# Patient Record
Sex: Female | Born: 1945 | Race: White | Hispanic: No | Marital: Married
Health system: Southern US, Community
[De-identification: ages and names within clinical notes are randomized; demographics above are authoritative.]

## PROBLEM LIST (undated history)

## (undated) DIAGNOSIS — R112 Nausea with vomiting, unspecified: Secondary | ICD-10-CM

## (undated) DIAGNOSIS — Z9889 Other specified postprocedural states: Secondary | ICD-10-CM

## (undated) DIAGNOSIS — M1990 Unspecified osteoarthritis, unspecified site: Secondary | ICD-10-CM

## (undated) DIAGNOSIS — K219 Gastro-esophageal reflux disease without esophagitis: Secondary | ICD-10-CM

## (undated) DIAGNOSIS — F32A Depression, unspecified: Secondary | ICD-10-CM

## (undated) DIAGNOSIS — F419 Anxiety disorder, unspecified: Secondary | ICD-10-CM

## (undated) DIAGNOSIS — Z90710 Acquired absence of both cervix and uterus: Secondary | ICD-10-CM

## (undated) DIAGNOSIS — R0600 Dyspnea, unspecified: Secondary | ICD-10-CM

## (undated) DIAGNOSIS — R002 Palpitations: Secondary | ICD-10-CM

## (undated) DIAGNOSIS — R011 Cardiac murmur, unspecified: Secondary | ICD-10-CM

## (undated) DIAGNOSIS — F329 Major depressive disorder, single episode, unspecified: Secondary | ICD-10-CM

## (undated) DIAGNOSIS — I2510 Atherosclerotic heart disease of native coronary artery without angina pectoris: Secondary | ICD-10-CM

## (undated) DIAGNOSIS — R0609 Other forms of dyspnea: Secondary | ICD-10-CM

## (undated) DIAGNOSIS — I10 Essential (primary) hypertension: Secondary | ICD-10-CM

## (undated) DIAGNOSIS — Y249XXA Unspecified firearm discharge, undetermined intent, initial encounter: Secondary | ICD-10-CM

## (undated) DIAGNOSIS — W3400XA Accidental discharge from unspecified firearms or gun, initial encounter: Secondary | ICD-10-CM

## (undated) HISTORY — DX: Accidental discharge from unspecified firearms or gun, initial encounter: W34.00XA

## (undated) HISTORY — DX: Acquired absence of both cervix and uterus: Z90.710

## (undated) HISTORY — DX: Dyspnea, unspecified: R06.00

## (undated) HISTORY — DX: Anxiety disorder, unspecified: F41.9

## (undated) HISTORY — PX: OTHER SURGICAL HISTORY: SHX169

## (undated) HISTORY — DX: Other forms of dyspnea: R06.09

## (undated) HISTORY — DX: Essential (primary) hypertension: I10

## (undated) HISTORY — DX: Gastro-esophageal reflux disease without esophagitis: K21.9

## (undated) HISTORY — DX: Unspecified firearm discharge, undetermined intent, initial encounter: Y24.9XXA

## (undated) HISTORY — DX: Major depressive disorder, single episode, unspecified: F32.9

## (undated) HISTORY — PX: BREAST BIOPSY: SHX20

## (undated) HISTORY — DX: Cardiac murmur, unspecified: R01.1

## (undated) HISTORY — DX: Depression, unspecified: F32.A

---

## 1998-09-12 ENCOUNTER — Other Ambulatory Visit: Admission: RE | Admit: 1998-09-12 | Discharge: 1998-09-12 | Payer: Self-pay | Admitting: Gynecology

## 1999-08-15 ENCOUNTER — Other Ambulatory Visit: Admission: RE | Admit: 1999-08-15 | Discharge: 1999-08-15 | Payer: Self-pay | Admitting: Obstetrics and Gynecology

## 2000-07-30 ENCOUNTER — Other Ambulatory Visit: Admission: RE | Admit: 2000-07-30 | Discharge: 2000-07-30 | Payer: Self-pay | Admitting: Obstetrics and Gynecology

## 2001-09-02 ENCOUNTER — Other Ambulatory Visit: Admission: RE | Admit: 2001-09-02 | Discharge: 2001-09-02 | Payer: Self-pay | Admitting: Obstetrics and Gynecology

## 2015-06-06 DIAGNOSIS — R262 Difficulty in walking, not elsewhere classified: Secondary | ICD-10-CM | POA: Diagnosis not present

## 2015-06-06 DIAGNOSIS — M25562 Pain in left knee: Secondary | ICD-10-CM | POA: Diagnosis not present

## 2015-06-06 DIAGNOSIS — M25561 Pain in right knee: Secondary | ICD-10-CM | POA: Diagnosis not present

## 2015-06-06 DIAGNOSIS — M170 Bilateral primary osteoarthritis of knee: Secondary | ICD-10-CM | POA: Diagnosis not present

## 2015-06-08 DIAGNOSIS — H04122 Dry eye syndrome of left lacrimal gland: Secondary | ICD-10-CM | POA: Diagnosis not present

## 2015-06-08 DIAGNOSIS — H04121 Dry eye syndrome of right lacrimal gland: Secondary | ICD-10-CM | POA: Diagnosis not present

## 2015-06-08 DIAGNOSIS — H02413 Mechanical ptosis of bilateral eyelids: Secondary | ICD-10-CM | POA: Diagnosis not present

## 2015-06-08 DIAGNOSIS — H02831 Dermatochalasis of right upper eyelid: Secondary | ICD-10-CM | POA: Diagnosis not present

## 2015-06-08 DIAGNOSIS — H02834 Dermatochalasis of left upper eyelid: Secondary | ICD-10-CM | POA: Diagnosis not present

## 2015-06-08 DIAGNOSIS — H04123 Dry eye syndrome of bilateral lacrimal glands: Secondary | ICD-10-CM | POA: Diagnosis not present

## 2015-06-12 DIAGNOSIS — M25562 Pain in left knee: Secondary | ICD-10-CM | POA: Diagnosis not present

## 2015-06-12 DIAGNOSIS — M1712 Unilateral primary osteoarthritis, left knee: Secondary | ICD-10-CM | POA: Diagnosis not present

## 2015-06-15 DIAGNOSIS — M25561 Pain in right knee: Secondary | ICD-10-CM | POA: Diagnosis not present

## 2015-06-15 DIAGNOSIS — M1711 Unilateral primary osteoarthritis, right knee: Secondary | ICD-10-CM | POA: Diagnosis not present

## 2015-06-19 DIAGNOSIS — M1712 Unilateral primary osteoarthritis, left knee: Secondary | ICD-10-CM | POA: Diagnosis not present

## 2015-06-19 DIAGNOSIS — M25562 Pain in left knee: Secondary | ICD-10-CM | POA: Diagnosis not present

## 2015-06-21 DIAGNOSIS — H6592 Unspecified nonsuppurative otitis media, left ear: Secondary | ICD-10-CM | POA: Diagnosis not present

## 2015-06-21 DIAGNOSIS — Z9622 Myringotomy tube(s) status: Secondary | ICD-10-CM | POA: Diagnosis not present

## 2015-06-22 DIAGNOSIS — M25561 Pain in right knee: Secondary | ICD-10-CM | POA: Diagnosis not present

## 2015-06-22 DIAGNOSIS — M1711 Unilateral primary osteoarthritis, right knee: Secondary | ICD-10-CM | POA: Diagnosis not present

## 2015-06-26 DIAGNOSIS — M1712 Unilateral primary osteoarthritis, left knee: Secondary | ICD-10-CM | POA: Diagnosis not present

## 2015-06-26 DIAGNOSIS — M25519 Pain in unspecified shoulder: Secondary | ICD-10-CM | POA: Diagnosis not present

## 2015-06-26 DIAGNOSIS — M25562 Pain in left knee: Secondary | ICD-10-CM | POA: Diagnosis not present

## 2015-06-26 DIAGNOSIS — M19012 Primary osteoarthritis, left shoulder: Secondary | ICD-10-CM | POA: Diagnosis not present

## 2015-06-29 DIAGNOSIS — M25561 Pain in right knee: Secondary | ICD-10-CM | POA: Diagnosis not present

## 2015-06-29 DIAGNOSIS — M1711 Unilateral primary osteoarthritis, right knee: Secondary | ICD-10-CM | POA: Diagnosis not present

## 2015-07-03 DIAGNOSIS — M25562 Pain in left knee: Secondary | ICD-10-CM | POA: Diagnosis not present

## 2015-07-03 DIAGNOSIS — M1712 Unilateral primary osteoarthritis, left knee: Secondary | ICD-10-CM | POA: Diagnosis not present

## 2015-07-06 DIAGNOSIS — M25561 Pain in right knee: Secondary | ICD-10-CM | POA: Diagnosis not present

## 2015-07-06 DIAGNOSIS — M1711 Unilateral primary osteoarthritis, right knee: Secondary | ICD-10-CM | POA: Diagnosis not present

## 2015-07-11 DIAGNOSIS — I10 Essential (primary) hypertension: Secondary | ICD-10-CM | POA: Diagnosis not present

## 2015-07-11 DIAGNOSIS — M159 Polyosteoarthritis, unspecified: Secondary | ICD-10-CM | POA: Diagnosis not present

## 2015-07-11 DIAGNOSIS — E782 Mixed hyperlipidemia: Secondary | ICD-10-CM | POA: Diagnosis not present

## 2015-07-21 DIAGNOSIS — Z1231 Encounter for screening mammogram for malignant neoplasm of breast: Secondary | ICD-10-CM | POA: Diagnosis not present

## 2015-07-31 DIAGNOSIS — E785 Hyperlipidemia, unspecified: Secondary | ICD-10-CM | POA: Diagnosis not present

## 2015-07-31 DIAGNOSIS — K219 Gastro-esophageal reflux disease without esophagitis: Secondary | ICD-10-CM | POA: Diagnosis not present

## 2015-07-31 DIAGNOSIS — Z7982 Long term (current) use of aspirin: Secondary | ICD-10-CM | POA: Diagnosis not present

## 2015-07-31 DIAGNOSIS — R002 Palpitations: Secondary | ICD-10-CM | POA: Diagnosis not present

## 2015-07-31 DIAGNOSIS — M1990 Unspecified osteoarthritis, unspecified site: Secondary | ICD-10-CM | POA: Diagnosis not present

## 2015-07-31 DIAGNOSIS — Z79899 Other long term (current) drug therapy: Secondary | ICD-10-CM | POA: Diagnosis not present

## 2015-07-31 DIAGNOSIS — H02413 Mechanical ptosis of bilateral eyelids: Secondary | ICD-10-CM | POA: Diagnosis not present

## 2015-07-31 DIAGNOSIS — F329 Major depressive disorder, single episode, unspecified: Secondary | ICD-10-CM | POA: Diagnosis not present

## 2015-07-31 DIAGNOSIS — H02834 Dermatochalasis of left upper eyelid: Secondary | ICD-10-CM | POA: Diagnosis not present

## 2015-07-31 DIAGNOSIS — I10 Essential (primary) hypertension: Secondary | ICD-10-CM | POA: Diagnosis not present

## 2015-07-31 DIAGNOSIS — H02831 Dermatochalasis of right upper eyelid: Secondary | ICD-10-CM | POA: Diagnosis not present

## 2015-09-07 DIAGNOSIS — H04123 Dry eye syndrome of bilateral lacrimal glands: Secondary | ICD-10-CM | POA: Diagnosis not present

## 2015-09-07 DIAGNOSIS — H04121 Dry eye syndrome of right lacrimal gland: Secondary | ICD-10-CM | POA: Diagnosis not present

## 2015-09-07 DIAGNOSIS — H04122 Dry eye syndrome of left lacrimal gland: Secondary | ICD-10-CM | POA: Diagnosis not present

## 2015-10-09 DIAGNOSIS — N951 Menopausal and female climacteric states: Secondary | ICD-10-CM | POA: Diagnosis not present

## 2015-10-09 DIAGNOSIS — Z01419 Encounter for gynecological examination (general) (routine) without abnormal findings: Secondary | ICD-10-CM | POA: Diagnosis not present

## 2015-10-09 DIAGNOSIS — Z124 Encounter for screening for malignant neoplasm of cervix: Secondary | ICD-10-CM | POA: Diagnosis not present

## 2015-10-12 DIAGNOSIS — M542 Cervicalgia: Secondary | ICD-10-CM | POA: Diagnosis not present

## 2015-10-12 DIAGNOSIS — M1990 Unspecified osteoarthritis, unspecified site: Secondary | ICD-10-CM | POA: Diagnosis not present

## 2015-10-12 DIAGNOSIS — M791 Myalgia: Secondary | ICD-10-CM | POA: Diagnosis not present

## 2015-10-12 DIAGNOSIS — M79641 Pain in right hand: Secondary | ICD-10-CM | POA: Diagnosis not present

## 2015-10-13 DIAGNOSIS — H04122 Dry eye syndrome of left lacrimal gland: Secondary | ICD-10-CM | POA: Diagnosis not present

## 2015-10-13 DIAGNOSIS — H04121 Dry eye syndrome of right lacrimal gland: Secondary | ICD-10-CM | POA: Diagnosis not present

## 2015-10-13 DIAGNOSIS — H04123 Dry eye syndrome of bilateral lacrimal glands: Secondary | ICD-10-CM | POA: Diagnosis not present

## 2015-12-11 DIAGNOSIS — M25562 Pain in left knee: Secondary | ICD-10-CM | POA: Diagnosis not present

## 2015-12-11 DIAGNOSIS — M170 Bilateral primary osteoarthritis of knee: Secondary | ICD-10-CM | POA: Diagnosis not present

## 2015-12-11 DIAGNOSIS — M25561 Pain in right knee: Secondary | ICD-10-CM | POA: Diagnosis not present

## 2016-01-16 DIAGNOSIS — F418 Other specified anxiety disorders: Secondary | ICD-10-CM | POA: Diagnosis not present

## 2016-01-16 DIAGNOSIS — Z780 Asymptomatic menopausal state: Secondary | ICD-10-CM | POA: Diagnosis not present

## 2016-01-16 DIAGNOSIS — Z9229 Personal history of other drug therapy: Secondary | ICD-10-CM | POA: Diagnosis not present

## 2016-01-16 DIAGNOSIS — K219 Gastro-esophageal reflux disease without esophagitis: Secondary | ICD-10-CM | POA: Diagnosis not present

## 2016-01-16 DIAGNOSIS — M1612 Unilateral primary osteoarthritis, left hip: Secondary | ICD-10-CM | POA: Diagnosis not present

## 2016-01-24 DIAGNOSIS — L820 Inflamed seborrheic keratosis: Secondary | ICD-10-CM | POA: Diagnosis not present

## 2016-01-24 DIAGNOSIS — I780 Hereditary hemorrhagic telangiectasia: Secondary | ICD-10-CM | POA: Diagnosis not present

## 2016-01-24 DIAGNOSIS — M859 Disorder of bone density and structure, unspecified: Secondary | ICD-10-CM | POA: Diagnosis not present

## 2016-01-24 DIAGNOSIS — L739 Follicular disorder, unspecified: Secondary | ICD-10-CM | POA: Diagnosis not present

## 2016-01-24 DIAGNOSIS — Z01411 Encounter for gynecological examination (general) (routine) with abnormal findings: Secondary | ICD-10-CM | POA: Diagnosis not present

## 2016-02-19 DIAGNOSIS — M1612 Unilateral primary osteoarthritis, left hip: Secondary | ICD-10-CM | POA: Diagnosis not present

## 2016-02-19 DIAGNOSIS — M85852 Other specified disorders of bone density and structure, left thigh: Secondary | ICD-10-CM | POA: Diagnosis not present

## 2016-02-19 DIAGNOSIS — Z23 Encounter for immunization: Secondary | ICD-10-CM | POA: Diagnosis not present

## 2016-02-19 DIAGNOSIS — F418 Other specified anxiety disorders: Secondary | ICD-10-CM | POA: Diagnosis not present

## 2016-02-19 DIAGNOSIS — Z13220 Encounter for screening for lipoid disorders: Secondary | ICD-10-CM | POA: Diagnosis not present

## 2016-02-19 DIAGNOSIS — K219 Gastro-esophageal reflux disease without esophagitis: Secondary | ICD-10-CM | POA: Diagnosis not present

## 2016-02-19 DIAGNOSIS — Z0000 Encounter for general adult medical examination without abnormal findings: Secondary | ICD-10-CM | POA: Diagnosis not present

## 2016-03-25 DIAGNOSIS — F39 Unspecified mood [affective] disorder: Secondary | ICD-10-CM | POA: Diagnosis not present

## 2016-03-25 DIAGNOSIS — M8588 Other specified disorders of bone density and structure, other site: Secondary | ICD-10-CM | POA: Diagnosis not present

## 2016-03-25 DIAGNOSIS — N951 Menopausal and female climacteric states: Secondary | ICD-10-CM | POA: Diagnosis not present

## 2016-03-25 DIAGNOSIS — M8589 Other specified disorders of bone density and structure, multiple sites: Secondary | ICD-10-CM | POA: Diagnosis not present

## 2016-04-22 ENCOUNTER — Other Ambulatory Visit: Payer: Self-pay | Admitting: Obstetrics and Gynecology

## 2016-04-22 DIAGNOSIS — Z1231 Encounter for screening mammogram for malignant neoplasm of breast: Secondary | ICD-10-CM

## 2016-05-01 DIAGNOSIS — M170 Bilateral primary osteoarthritis of knee: Secondary | ICD-10-CM | POA: Diagnosis not present

## 2016-05-01 DIAGNOSIS — M25561 Pain in right knee: Secondary | ICD-10-CM | POA: Diagnosis not present

## 2016-05-01 DIAGNOSIS — M19012 Primary osteoarthritis, left shoulder: Secondary | ICD-10-CM | POA: Diagnosis not present

## 2016-05-01 DIAGNOSIS — M25562 Pain in left knee: Secondary | ICD-10-CM | POA: Diagnosis not present

## 2016-05-01 DIAGNOSIS — M25512 Pain in left shoulder: Secondary | ICD-10-CM | POA: Diagnosis not present

## 2016-05-03 ENCOUNTER — Other Ambulatory Visit: Payer: Self-pay | Admitting: Gastroenterology

## 2016-05-08 DIAGNOSIS — M1711 Unilateral primary osteoarthritis, right knee: Secondary | ICD-10-CM | POA: Diagnosis not present

## 2016-05-08 DIAGNOSIS — M25561 Pain in right knee: Secondary | ICD-10-CM | POA: Diagnosis not present

## 2016-05-08 DIAGNOSIS — Z136 Encounter for screening for cardiovascular disorders: Secondary | ICD-10-CM | POA: Diagnosis not present

## 2016-05-09 DIAGNOSIS — M1712 Unilateral primary osteoarthritis, left knee: Secondary | ICD-10-CM | POA: Diagnosis not present

## 2016-05-09 DIAGNOSIS — M25562 Pain in left knee: Secondary | ICD-10-CM | POA: Diagnosis not present

## 2016-05-15 DIAGNOSIS — M25561 Pain in right knee: Secondary | ICD-10-CM | POA: Diagnosis not present

## 2016-05-15 DIAGNOSIS — M1711 Unilateral primary osteoarthritis, right knee: Secondary | ICD-10-CM | POA: Diagnosis not present

## 2016-05-16 DIAGNOSIS — M1712 Unilateral primary osteoarthritis, left knee: Secondary | ICD-10-CM | POA: Diagnosis not present

## 2016-05-16 DIAGNOSIS — M25562 Pain in left knee: Secondary | ICD-10-CM | POA: Diagnosis not present

## 2016-05-22 DIAGNOSIS — M25561 Pain in right knee: Secondary | ICD-10-CM | POA: Diagnosis not present

## 2016-05-22 DIAGNOSIS — M1711 Unilateral primary osteoarthritis, right knee: Secondary | ICD-10-CM | POA: Diagnosis not present

## 2016-05-23 DIAGNOSIS — M25562 Pain in left knee: Secondary | ICD-10-CM | POA: Diagnosis not present

## 2016-05-23 DIAGNOSIS — M1712 Unilateral primary osteoarthritis, left knee: Secondary | ICD-10-CM | POA: Diagnosis not present

## 2016-05-30 DIAGNOSIS — M25562 Pain in left knee: Secondary | ICD-10-CM | POA: Diagnosis not present

## 2016-05-30 DIAGNOSIS — M25561 Pain in right knee: Secondary | ICD-10-CM | POA: Diagnosis not present

## 2016-05-30 DIAGNOSIS — M170 Bilateral primary osteoarthritis of knee: Secondary | ICD-10-CM | POA: Diagnosis not present

## 2016-06-05 ENCOUNTER — Encounter (HOSPITAL_COMMUNITY): Payer: Self-pay | Admitting: *Deleted

## 2016-06-10 ENCOUNTER — Ambulatory Visit (HOSPITAL_COMMUNITY): Payer: Medicare HMO | Admitting: Anesthesiology

## 2016-06-10 ENCOUNTER — Encounter (HOSPITAL_COMMUNITY)
Admission: RE | Disposition: A | Discharge status: Home / Self Care | Payer: Self-pay | Source: Ambulatory Visit | Attending: Gastroenterology

## 2016-06-10 ENCOUNTER — Encounter (HOSPITAL_COMMUNITY): Payer: Self-pay | Admitting: *Deleted

## 2016-06-10 ENCOUNTER — Ambulatory Visit (HOSPITAL_COMMUNITY)
Admission: RE | Admit: 2016-06-10 | Discharge: 2016-06-10 | Disposition: A | Discharge status: Home / Self Care | Payer: Medicare HMO | Source: Ambulatory Visit | Attending: Gastroenterology | Admitting: Gastroenterology

## 2016-06-10 DIAGNOSIS — Z1211 Encounter for screening for malignant neoplasm of colon: Secondary | ICD-10-CM | POA: Diagnosis not present

## 2016-06-10 DIAGNOSIS — F418 Other specified anxiety disorders: Secondary | ICD-10-CM | POA: Insufficient documentation

## 2016-06-10 DIAGNOSIS — Z90710 Acquired absence of both cervix and uterus: Secondary | ICD-10-CM | POA: Diagnosis not present

## 2016-06-10 DIAGNOSIS — M1990 Unspecified osteoarthritis, unspecified site: Secondary | ICD-10-CM | POA: Insufficient documentation

## 2016-06-10 DIAGNOSIS — K219 Gastro-esophageal reflux disease without esophagitis: Secondary | ICD-10-CM | POA: Insufficient documentation

## 2016-06-10 HISTORY — DX: Nausea with vomiting, unspecified: R11.2

## 2016-06-10 HISTORY — DX: Other specified postprocedural states: R11.2

## 2016-06-10 HISTORY — DX: Unspecified osteoarthritis, unspecified site: M19.90

## 2016-06-10 HISTORY — PX: COLONOSCOPY WITH PROPOFOL: SHX5780

## 2016-06-10 HISTORY — DX: Other specified postprocedural states: Z98.890

## 2016-06-10 SURGERY — COLONOSCOPY WITH PROPOFOL
Anesthesia: Monitor Anesthesia Care

## 2016-06-10 MED ORDER — SODIUM CHLORIDE 0.9 % IV SOLN: INTRAVENOUS | Status: DC

## 2016-06-10 MED ORDER — ONDANSETRON HCL 4 MG/2ML IJ SOLN
INTRAMUSCULAR | Status: AC
  Filled 2016-06-10: qty 2

## 2016-06-10 MED ORDER — PROPOFOL 10 MG/ML IV BOLUS
INTRAVENOUS | Status: DC | PRN
  Administered 2016-06-10: 20 mg via INTRAVENOUS

## 2016-06-10 MED ORDER — PROPOFOL 10 MG/ML IV BOLUS
INTRAVENOUS | Status: AC
  Filled 2016-06-10: qty 40

## 2016-06-10 MED ORDER — LIDOCAINE 2% (20 MG/ML) 5 ML SYRINGE
INTRAMUSCULAR | Status: AC
  Filled 2016-06-10: qty 5

## 2016-06-10 MED ORDER — PROPOFOL 500 MG/50ML IV EMUL
INTRAVENOUS | Status: DC | PRN
  Administered 2016-06-10: 200 ug/kg/min via INTRAVENOUS

## 2016-06-10 MED ORDER — LACTATED RINGERS IV SOLN
INTRAVENOUS | Status: DC | PRN
  Administered 2016-06-10: 10:00:00 via INTRAVENOUS

## 2016-06-10 MED ORDER — ONDANSETRON HCL 4 MG/2ML IJ SOLN
INTRAMUSCULAR | Status: DC | PRN
Start: 2016-06-10 — End: 2016-06-10
  Administered 2016-06-10: 4 mg via INTRAVENOUS

## 2016-06-10 SURGICAL SUPPLY — 22 items
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
## 2016-06-10 NOTE — Discharge Instructions (Signed)

## 2016-06-10 NOTE — Transfer of Care (Signed)
Immediate Anesthesia Transfer of Care Note  Patient: Christina Melendez  Procedure(s) Performed: Procedure(s): COLONOSCOPY WITH PROPOFOL (N/A)  Patient Location: PACU  Anesthesia Type:MAC  Level of Consciousness:  sedated, patient cooperative and responds to stimulation  Airway & Oxygen Therapy:Patient Spontanous Breathing and Patient connected to face mask oxgen  Post-op Assessment:  Report given to PACU RN and Post -op Vital signs reviewed and stable  Post vital signs:  Reviewed and stable  Last Vitals:  Vitals:   06/10/16 1010  BP: (!) 157/68  Pulse: 88  Temp: 72.0 C    Complications: No apparent anesthesia complications

## 2016-06-10 NOTE — Addendum Note (Signed)
Addendum  created 06/10/16 1124 by Nolon Nations, MD   Sign clinical note

## 2016-06-10 NOTE — Anesthesia Preprocedure Evaluation (Signed)
Anesthesia Evaluation  Patient identified by MRN, date of birth, ID band Patient awake    Reviewed: Allergy & Precautions, NPO status , Patient's Chart, lab work & pertinent test results  History of Anesthesia Complications (+) PONV and history of anesthetic complications  Airway Mallampati: II  TM Distance: >3 FB Neck ROM: Full    Dental no notable dental hx.    Pulmonary neg pulmonary ROS,    Pulmonary exam normal breath sounds clear to auscultation       Cardiovascular negative cardio ROS Normal cardiovascular exam Rhythm:Regular Rate:Normal     Neuro/Psych negative neurological ROS  negative psych ROS   GI/Hepatic negative GI ROS, Neg liver ROS,   Endo/Other  negative endocrine ROS  Renal/GU negative Renal ROS     Musculoskeletal  (+) Arthritis ,   Abdominal   Peds  Hematology negative hematology ROS (+)   Anesthesia Other Findings   Reproductive/Obstetrics negative OB ROS                             Anesthesia Physical Anesthesia Plan  ASA: II  Anesthesia Plan: MAC   Post-op Pain Management:    Induction: Intravenous  Airway Management Planned:   Additional Equipment:   Intra-op Plan:   Post-operative Plan:   Informed Consent: I have reviewed the patients History and Physical, chart, labs and discussed the procedure including the risks, benefits and alternatives for the proposed anesthesia with the patient or authorized representative who has indicated his/her understanding and acceptance.   Dental advisory given  Plan Discussed with: CRNA  Anesthesia Plan Comments:         Anesthesia Quick Evaluation

## 2016-06-10 NOTE — Op Note (Signed)
[facility] Patient Name:  Procedure Date: 06/10/2016 MRN: [id number] Attending MD: [name] , MD Date of Birth: 06/18/1945 CSN: [id number] Age: 71 Admit Type: Outpatient Procedure:                Colonoscopy Indications:              Screening for colorectal malignant neoplasm Providers:                [name], MD, Vista Lawman, RN, Alfonso Patten, Technician, Virgia Land, CRNA Referring MD:              Medicines:                Propofol per Anesthesia Complications:            No immediate complications. Estimated Blood Loss:     Estimated blood loss: none. Procedure:                Pre-Anesthesia Assessment:                           - Prior to the procedure, a History and Physical                            was performed, and patient medications and                            allergies were reviewed. The patient's tolerance of                            previous anesthesia was also reviewed. The risks                            and benefits of the procedure and the sedation                            options and risks were discussed with the patient.                            All questions were answered, and informed consent                            was obtained. Prior Anticoagulants: The patient has                            taken aspirin, last dose was 1 day prior to                            procedure. ASA Grade Assessment: II - A patient                            with mild systemic disease. After reviewing the  risks and benefits, the patient was deemed in                            satisfactory condition to undergo the procedure.                           After obtaining informed consent, the colonoscope                            was passed under direct vision. Throughout the                            procedure, the patient's blood pressure, pulse, and            oxygen saturations were monitored continuously. The                            [id number] scope was introduced through                            the anus and advanced to the the cecum, identified                            by appendiceal orifice and ileocecal valve. The                            colonoscopy was performed without difficulty. The                            patient tolerated the procedure well. The quality                            of the bowel preparation was good. The appendiceal                            orifice and the rectum were photographed. Scope In: 10:19:34 AM Scope Out: 10:37:39 AM Scope Withdrawal Time: 0 hours 10 minutes 24 seconds  Total Procedure Duration: 0 hours 18 minutes 5 seconds  Findings:      The perianal and digital rectal examinations were normal.      The entire examined colon appeared normal. Impression:               - The entire examined colon is normal.                           - No specimens collected. Moderate Sedation:      N/A- Per Anesthesia Care Recommendation:           - Patient has a contact number available for                            emergencies. The signs and symptoms of potential                            delayed complications were discussed with the  patient. Return to normal activities tomorrow.                            Written discharge instructions were provided to the                            patient.                           - Repeat colonoscopy is not recommended for                            screening purposes.                           - Resume previous diet.                           - Continue present medications. Procedure Code(s):        --- Professional ---                           [id number], Colorectal cancer screening; colonoscopy on                            individual not meeting criteria for high risk Diagnosis Code(s):        --- Professional ---                            Z12.11, Encounter for screening for malignant                            neoplasm of colon CPT copyright 2016 American [employer] Association. All rights reserved. The codes documented in this report are preliminary and upon coder review may  be revised to meet current compliance requirements. Earle Gell, MD [name], MD 06/10/2016 10:44:22 AM This report has been signed electronically. Number of Addenda: 0

## 2016-06-10 NOTE — Anesthesia Postprocedure Evaluation (Addendum)
Anesthesia Post Note  Patient: Christina Melendez  Procedure(s) Performed: Procedure(s) (LRB): COLONOSCOPY WITH PROPOFOL (N/A)  Patient location during evaluation: PACU Anesthesia Type: MAC Level of consciousness: awake and alert Pain management: pain level controlled Vital Signs Assessment: post-procedure vital signs reviewed and stable Respiratory status: spontaneous breathing Cardiovascular status: stable Anesthetic complications: no       Last Vitals:  Vitals:   06/10/16 1100 06/10/16 1105  BP: 138/62   Pulse: 74 78  Resp: 18 (!) 23  Temp:      Last Pain:  Vitals:   06/10/16 1010  TempSrc: Oral                 Nolon Nations

## 2016-06-11 ENCOUNTER — Encounter (HOSPITAL_COMMUNITY): Payer: Self-pay | Admitting: Gastroenterology

## 2016-07-11 ENCOUNTER — Ambulatory Visit: Payer: Self-pay

## 2016-07-22 ENCOUNTER — Ambulatory Visit
Admission: RE | Admit: 2016-07-22 | Discharge: 2016-07-22 | Disposition: A | Discharge status: Home / Self Care | Payer: Medicare HMO | Source: Ambulatory Visit | Attending: Obstetrics and Gynecology | Admitting: Obstetrics and Gynecology

## 2016-07-22 DIAGNOSIS — Z1231 Encounter for screening mammogram for malignant neoplasm of breast: Secondary | ICD-10-CM

## 2017-05-31 ENCOUNTER — Other Ambulatory Visit: Payer: Self-pay | Admitting: Physician Assistant

## 2017-05-31 DIAGNOSIS — M25551 Pain in right hip: Secondary | ICD-10-CM

## 2017-05-31 DIAGNOSIS — M25552 Pain in left hip: Principal | ICD-10-CM

## 2017-06-12 ENCOUNTER — Other Ambulatory Visit: Payer: Self-pay | Admitting: Physician Assistant

## 2017-06-12 ENCOUNTER — Ambulatory Visit
Admission: RE | Admit: 2017-06-12 | Discharge: 2017-06-12 | Disposition: A | Discharge status: Home / Self Care | Payer: Medicare HMO | Source: Ambulatory Visit | Attending: Physician Assistant | Admitting: Physician Assistant

## 2017-06-12 DIAGNOSIS — M25552 Pain in left hip: Principal | ICD-10-CM

## 2017-06-12 DIAGNOSIS — M25551 Pain in right hip: Secondary | ICD-10-CM

## 2017-08-11 ENCOUNTER — Other Ambulatory Visit: Payer: Self-pay | Admitting: Obstetrics and Gynecology

## 2017-08-11 DIAGNOSIS — Z1231 Encounter for screening mammogram for malignant neoplasm of breast: Secondary | ICD-10-CM

## 2017-08-26 ENCOUNTER — Ambulatory Visit
Admission: RE | Admit: 2017-08-26 | Discharge: 2017-08-26 | Disposition: A | Discharge status: Home / Self Care | Payer: Medicare HMO | Source: Ambulatory Visit | Attending: Obstetrics and Gynecology | Admitting: Obstetrics and Gynecology

## 2017-08-26 DIAGNOSIS — Z1231 Encounter for screening mammogram for malignant neoplasm of breast: Secondary | ICD-10-CM

## 2018-01-26 DIAGNOSIS — D2310 Other benign neoplasm of skin of unspecified eyelid, including canthus: Secondary | ICD-10-CM | POA: Diagnosis not present

## 2018-01-26 DIAGNOSIS — H01111 Allergic dermatitis of right upper eyelid: Secondary | ICD-10-CM | POA: Diagnosis not present

## 2018-01-29 DIAGNOSIS — D2310 Other benign neoplasm of skin of unspecified eyelid, including canthus: Secondary | ICD-10-CM | POA: Diagnosis not present

## 2018-02-03 DIAGNOSIS — R5382 Chronic fatigue, unspecified: Secondary | ICD-10-CM | POA: Diagnosis not present

## 2018-02-11 ENCOUNTER — Ambulatory Visit: Payer: Medicare HMO | Admitting: Cardiovascular Disease

## 2018-02-17 DIAGNOSIS — Z79899 Other long term (current) drug therapy: Secondary | ICD-10-CM | POA: Diagnosis not present

## 2018-02-17 DIAGNOSIS — G894 Chronic pain syndrome: Secondary | ICD-10-CM | POA: Diagnosis not present

## 2018-02-23 ENCOUNTER — Other Ambulatory Visit: Payer: Self-pay | Admitting: Obstetrics and Gynecology

## 2018-02-23 DIAGNOSIS — Z01411 Encounter for gynecological examination (general) (routine) with abnormal findings: Secondary | ICD-10-CM | POA: Diagnosis not present

## 2018-02-23 DIAGNOSIS — R1032 Left lower quadrant pain: Secondary | ICD-10-CM | POA: Diagnosis not present

## 2018-02-23 DIAGNOSIS — N951 Menopausal and female climacteric states: Secondary | ICD-10-CM | POA: Diagnosis not present

## 2018-02-23 DIAGNOSIS — M810 Age-related osteoporosis without current pathological fracture: Secondary | ICD-10-CM | POA: Diagnosis not present

## 2018-02-23 DIAGNOSIS — N644 Mastodynia: Secondary | ICD-10-CM

## 2018-02-25 ENCOUNTER — Ambulatory Visit: Payer: Medicare HMO

## 2018-02-25 ENCOUNTER — Ambulatory Visit
Admission: RE | Admit: 2018-02-25 | Discharge: 2018-02-25 | Disposition: A | Discharge status: Home / Self Care | Payer: Medicare HMO | Source: Ambulatory Visit | Attending: Obstetrics and Gynecology | Admitting: Obstetrics and Gynecology

## 2018-02-25 DIAGNOSIS — N644 Mastodynia: Secondary | ICD-10-CM

## 2018-02-25 DIAGNOSIS — R928 Other abnormal and inconclusive findings on diagnostic imaging of breast: Secondary | ICD-10-CM | POA: Diagnosis not present

## 2018-03-03 ENCOUNTER — Ambulatory Visit: Payer: Medicare HMO | Admitting: Cardiovascular Disease

## 2018-03-03 ENCOUNTER — Encounter: Payer: Self-pay | Admitting: Cardiovascular Disease

## 2018-03-03 DIAGNOSIS — R011 Cardiac murmur, unspecified: Secondary | ICD-10-CM | POA: Diagnosis not present

## 2018-03-03 DIAGNOSIS — R0609 Other forms of dyspnea: Secondary | ICD-10-CM | POA: Diagnosis not present

## 2018-03-03 HISTORY — DX: Cardiac murmur, unspecified: R01.1

## 2018-03-03 NOTE — Assessment & Plan Note (Signed)
Ms. Parillo was referred by Dr. via for dyspnea.  She has a soft outflow tract murmur which certainly could represent aortic stenosis and/or sclerosis.  Given her new onset dyspnea over the last 6 months I am going to obtain a 2D echocardiogram to assess LV function and valvular function.

## 2018-03-03 NOTE — Assessment & Plan Note (Signed)
Christina Melendez was referred by Dr. via for new onset dyspnea on exertion over the last 6 months.  She has no history of tobacco abuse or occupational exposure.  She denies chest pain.  I am going to get a pharmacologic Myoview stress test to further evaluate and rule out an ischemic etiology.  She does have evidence of LVH with repolarization changes on her baseline EKG.

## 2018-03-03 NOTE — Patient Instructions (Signed)
Medication Instructions:  Your physician recommends that you continue on your current medications as directed. Please refer to the Current Medication list given to you today.   Labwork: none  Testing/Procedures: Your physician has requested that you have an echocardiogram. Echocardiography is a painless test that uses sound waves to create images of your heart. It provides your doctor with information about the size and shape of your heart and how well your heart's chambers and valves are working. This procedure takes approximately one hour. There are no restrictions for this procedure.    Follow-Up: Your physician recommends that you schedule a follow-up appointment in: 1 month with Dr. [name].   Any Other Special Instructions Will Be Listed Below (If Applicable).     If you need a refill on your cardiac medications before your next appointment, please call your pharmacy.

## 2018-03-03 NOTE — Progress Notes (Signed)
03/03/2018 Christina Melendez   [birth year]  [id number]  Primary Physician Pa, [place] Primary Cardiologist: Lorretta Harp MD Garret Reddish, [place], [place]  HPI:  Christina Melendez is a 72 y.o. moderately overweight married Caucasian female with no children referred by Dr. Lynelle Doctor for cardiovascular evaluation because of new onset dyspnea.  Christina Melendez is accompanied by Christina Melendez.  Christina Melendez.  Christina Melendez has no cardiovascular risk factors.  Christina Melendez walks approximately a mile a day.  There is no family history of heart disease.  Christina Melendez is never had a heart attack or stroke.  Christina Melendez has never smoked.  Christina Melendez is noticed increasing dyspnea the last 6 months especially when walking around the house.  Christina Melendez specifically denies chest pain.  Christina Melendez does have a soft outflow tract murmur by auscultation.   Current Meds  Medication Sig  . aspirin 81 MG tablet Take 81 mg by mouth daily.  Marland Kitchen CALCIUM PO Take 1,200 mg by mouth daily.  . Omega 3 1000 MG CAPS Take 2,000 mg by mouth 2 (two) times daily.  . temazepam (RESTORIL) 30 MG capsule Take 30 mg by mouth at bedtime.  . traMADol (ULTRAM) 50 MG tablet Take 50 mg by mouth every 12 (twelve) hours as needed for moderate pain.  Marland Kitchen venlafaxine XR (EFFEXOR-XR) 37.5 MG 24 hr capsule Take 37.5 mg by mouth daily with breakfast.     No Known Allergies  Social History   Socioeconomic History  . Marital status: Married    Spouse name: Not on file  . Number of children: Not on file  . Years of education: Not on file  . Highest education level: Not on file  Occupational History  . Not on file  Social Needs  . Financial resource strain: Not on file  . Food insecurity:    Worry: Not on file    Inability: Not on file  . Transportation needs:    Medical: Not on file    Non-medical: Not on file  Tobacco Use  . Smoking status: Never Smoker  . Smokeless tobacco: Never Used  Substance  and Sexual Activity  . Alcohol use: No  . Drug use: No  . Sexual activity: Not on file  Lifestyle  . Physical activity:    Days per week: Not on file    Minutes per session: Not on file  . Stress: Not on file  Relationships  . Social connections:    Talks on phone: Not on file    Gets together: Not on file    Attends religious service: Not on file    Active member of club or organization: Not on file    Attends meetings of clubs or organizations: Not on file    Relationship status: Not on file  . Intimate partner violence:    Fear of current or ex partner: Not on file    Emotionally abused: Not on file    Physically abused: Not on file    Forced sexual activity: Not on file  Other Topics Concern  . Not on file  Social History Narrative  . Not on file     Review of Systems: General: negative for chills, fever, night sweats or weight changes.  Cardiovascular: negative for chest pain, dyspnea on exertion, edema, orthopnea, palpitations, paroxysmal nocturnal dyspnea or shortness of breath Dermatological: negative for rash Respiratory: negative for cough or wheezing Urologic: negative for  hematuria Abdominal: negative for nausea, vomiting, diarrhea, bright red blood per rectum, melena, or hematemesis Neurologic: negative for visual changes, syncope, or dizziness All other systems reviewed and are otherwise negative except as noted above.    Blood pressure 132/70, pulse 85, height 5\' 4"  (1.626 m), weight 178 lb 6.4 oz (80.9 kg).  General appearance: alert and no distress Neck: no adenopathy, no carotid bruit, no JVD, supple, symmetrical, trachea midline and thyroid not enlarged, symmetric, no tenderness/mass/nodules Lungs: clear to auscultation bilaterally Heart: 1/6 soft outflow tract murmur consistent with aortic sclerosis and/or stenosis. Extremities: extremities normal, atraumatic, no cyanosis or edema Pulses: 2+ and symmetric Skin: Skin color, texture, turgor normal. No  rashes or lesions Neurologic: Alert and oriented X 3, normal strength and tone. Normal symmetric reflexes. Normal coordination and gait  EKG sinus rhythm at 85 with left ventricular hypertrophy, repolarization changes and left anterior fascicular block.  I personally reviewed this EKG.  ASSESSMENT AND PLAN:   Dyspnea on exertion Christina Melendez was referred by Dr. via for new onset dyspnea on exertion over the last 6 months.  Christina Melendez has no history of tobacco abuse or occupational exposure.  Christina Melendez denies chest pain.  I am going to get a pharmacologic Myoview stress test to further evaluate and rule out an ischemic etiology.  Christina Melendez does have evidence of LVH with repolarization changes on Christina Melendez baseline EKG.  Cardiac murmur Christina Melendez was referred by Dr. via for dyspnea.  Christina Melendez has a soft outflow tract murmur which certainly could represent aortic stenosis and/or sclerosis.  Given Christina Melendez new onset dyspnea over the last 6 months I am going to obtain a 2D echocardiogram to assess LV function and valvular function.      Lorretta Harp MD FACP,FACC,FAHA, [facility] 03/03/2018 9:10 AM

## 2018-03-03 NOTE — Addendum Note (Signed)
Addended by: Newt Minion on: 03/03/2018 09:31 AM   Modules accepted: Orders

## 2018-03-05 DIAGNOSIS — M1711 Unilateral primary osteoarthritis, right knee: Secondary | ICD-10-CM | POA: Diagnosis not present

## 2018-03-05 DIAGNOSIS — M1712 Unilateral primary osteoarthritis, left knee: Secondary | ICD-10-CM | POA: Diagnosis not present

## 2018-03-19 DIAGNOSIS — F39 Unspecified mood [affective] disorder: Secondary | ICD-10-CM | POA: Diagnosis not present

## 2018-03-19 DIAGNOSIS — G4700 Insomnia, unspecified: Secondary | ICD-10-CM | POA: Diagnosis not present

## 2018-03-19 DIAGNOSIS — Z0000 Encounter for general adult medical examination without abnormal findings: Secondary | ICD-10-CM | POA: Diagnosis not present

## 2018-03-23 ENCOUNTER — Other Ambulatory Visit: Payer: Self-pay

## 2018-03-23 ENCOUNTER — Ambulatory Visit (HOSPITAL_COMMUNITY): Payer: Medicare HMO | Attending: Cardiovascular Disease

## 2018-03-23 DIAGNOSIS — R011 Cardiac murmur, unspecified: Secondary | ICD-10-CM

## 2018-03-23 DIAGNOSIS — R0609 Other forms of dyspnea: Secondary | ICD-10-CM | POA: Insufficient documentation

## 2018-03-23 MED ORDER — PERFLUTREN LIPID MICROSPHERE
1.0000 mL | INTRAVENOUS | Status: AC | PRN
  Administered 2018-03-23: 3 mL via INTRAVENOUS

## 2018-03-24 ENCOUNTER — Telehealth: Payer: Self-pay | Admitting: Cardiovascular Disease

## 2018-03-24 DIAGNOSIS — R0609 Other forms of dyspnea: Principal | ICD-10-CM

## 2018-03-24 NOTE — Telephone Encounter (Signed)
Called patient gave ECHO results. Patient is still concerned with her SOB and states she does not know what else she can do and requested that I ask Dr.Berry. Please advise.  Results of ECHO also sent to her OBGYN as requested.

## 2018-03-24 NOTE — Telephone Encounter (Signed)
Lets get a coronary CTA on her just to make sure this is not ischemically mediated

## 2018-03-24 NOTE — Telephone Encounter (Signed)
New Message ° ° ° ° ° ° ° ° ° °Patient returned your call, would like a call back. °

## 2018-03-24 NOTE — Telephone Encounter (Signed)
Left message for pt to call, order placed for cardiac CTA.

## 2018-03-25 NOTE — Telephone Encounter (Signed)
Follow up   Pt states she just spoke with a nurse about her echo results and would like to speak with her again. Please call

## 2018-03-25 NOTE — Telephone Encounter (Signed)
Returned call to patient she stated she was calling Debra back.Stated she would like echo results sent to [facility].Echo faxed to [facility] at fax # [phone].Message sent to Fredia Beets RN.

## 2018-03-25 NOTE — Telephone Encounter (Signed)
Spoke with pt, aware results fax to the number provided. Also aware of dr berry's recommendations regarding CTA.

## 2018-03-25 NOTE — Telephone Encounter (Signed)
Patient returning call from yesterday

## 2018-03-27 DIAGNOSIS — K219 Gastro-esophageal reflux disease without esophagitis: Secondary | ICD-10-CM | POA: Insufficient documentation

## 2018-03-30 ENCOUNTER — Other Ambulatory Visit (HOSPITAL_COMMUNITY): Payer: Medicare HMO

## 2018-03-30 DIAGNOSIS — M810 Age-related osteoporosis without current pathological fracture: Secondary | ICD-10-CM | POA: Diagnosis not present

## 2018-03-31 ENCOUNTER — Telehealth: Payer: Self-pay | Admitting: Cardiovascular Disease

## 2018-03-31 DIAGNOSIS — N951 Menopausal and female climacteric states: Secondary | ICD-10-CM | POA: Diagnosis not present

## 2018-03-31 DIAGNOSIS — R0609 Other forms of dyspnea: Principal | ICD-10-CM

## 2018-03-31 DIAGNOSIS — Z01812 Encounter for preprocedural laboratory examination: Secondary | ICD-10-CM

## 2018-03-31 NOTE — Telephone Encounter (Signed)
  Patient states that she is having some pain going down left arm and some shortness of breath at times. Patient would like advisement on whether or not she should be seen. Not having any chest pain.

## 2018-03-31 NOTE — Telephone Encounter (Signed)
Spoke with pt. Pt sts that she has been having left arm pain and sob intermittently for the last couple of months. The left arm pain does not radiate, she describes it as an aching. Pt sts she was awaken by the pain last night. She denies chest pain, pressure, tightness. Nothing makes it better or worst. Pt was currently out grocery shopping and she is still having the throbbing in your left arm. She sts that she was calling to update Hilda Blades, Therapist, sports. She is wanting to know if she could expedite the scheduling of her Coronary CT. Adv pt that the CT's are scheduled a couple of weeks out.  Adv pt that I will fwd an update to Dr.Berry and Hilda Blades, Therapist, sports. We will give her a call back with MD recommendation.

## 2018-04-01 DIAGNOSIS — R110 Nausea: Secondary | ICD-10-CM | POA: Diagnosis not present

## 2018-04-02 NOTE — Telephone Encounter (Signed)
See if we can push up the Cor CTA

## 2018-04-03 ENCOUNTER — Ambulatory Visit: Payer: Medicare HMO | Admitting: Cardiovascular Disease

## 2018-04-04 ENCOUNTER — Emergency Department (HOSPITAL_COMMUNITY)
Admission: EM | Admit: 2018-04-04 | Discharge: 2018-04-04 | Disposition: A | Discharge status: Home / Self Care | Payer: Medicare HMO | Attending: Emergency Medicine | Admitting: Emergency Medicine

## 2018-04-04 ENCOUNTER — Encounter: Payer: Self-pay | Admitting: Emergency Medicine

## 2018-04-04 DIAGNOSIS — Z5321 Procedure and treatment not carried out due to patient leaving prior to being seen by health care provider: Secondary | ICD-10-CM | POA: Insufficient documentation

## 2018-04-04 DIAGNOSIS — R51 Headache: Secondary | ICD-10-CM | POA: Insufficient documentation

## 2018-04-04 DIAGNOSIS — R110 Nausea: Secondary | ICD-10-CM | POA: Insufficient documentation

## 2018-04-04 DIAGNOSIS — R5381 Other malaise: Secondary | ICD-10-CM | POA: Diagnosis not present

## 2018-04-04 DIAGNOSIS — R61 Generalized hyperhidrosis: Secondary | ICD-10-CM | POA: Diagnosis not present

## 2018-04-04 LAB — COMPREHENSIVE METABOLIC PANEL
ALT: 18 U/L (ref 0–44)
AST: 21 U/L (ref 15–41)
Albumin: 4 g/dL (ref 3.5–5.0)
Alkaline Phosphatase: 97 U/L (ref 38–126)
Anion gap: 6 (ref 5–15)
BUN: 23 mg/dL (ref 8–23)
CHLORIDE: 101 mmol/L (ref 98–111)
CO2: 30 mmol/L (ref 22–32)
CREATININE: 0.88 mg/dL (ref 0.44–1.00)
Calcium: 10.6 mg/dL — ABNORMAL HIGH (ref 8.9–10.3)
GFR calc non Af Amer: >60 mL/min (ref >60)
Glucose, Bld: 100 mg/dL — ABNORMAL HIGH (ref 70–99)
POTASSIUM: 4 mmol/L (ref 3.5–5.1)
SODIUM: 137 mmol/L (ref 135–145)
Total Bilirubin: 0.6 mg/dL (ref 0.3–1.2)
Total Protein: 6.7 g/dL (ref 6.5–8.1)

## 2018-04-04 LAB — LIPASE, BLOOD: LIPASE: 44 U/L (ref 11–51)

## 2018-04-04 LAB — CBC
HEMATOCRIT: 40.8 % (ref 36.0–46.0)
HEMOGLOBIN: 13.3 g/dL (ref 12.0–15.0)
MCH: 30.5 pg (ref 26.0–34.0)
MCHC: 32.6 g/dL (ref 30.0–36.0)
MCV: 93.6 fL (ref 80.0–100.0)
NRBC: 0 % (ref 0.0–0.2)
Platelets: 234 10*3/uL (ref 150–400)
RBC: 4.36 MIL/uL (ref 3.87–5.11)
RDW: 13.5 % (ref 11.5–15.5)
WBC: 6.9 10*3/uL (ref 4.0–10.5)

## 2018-04-04 NOTE — ED Triage Notes (Signed)
Pt states she "hasn't been feeling well" x several weeks. Pt recently d/c hormones and developed weakness and nausea with headaches. Pt then restarted estradiol 5 days ago and states symptoms have been worsening every day.

## 2018-04-04 NOTE — ED Notes (Signed)
Pt turned labels in to registration and left after triage and labs.

## 2018-04-06 ENCOUNTER — Encounter: Payer: Self-pay | Admitting: Emergency Medicine

## 2018-04-06 NOTE — Telephone Encounter (Signed)
Pt scheduled on 11/7 for Cor. CTA.  Pt order labs for BMET and coming in on Tuesday, 11/5 to complete and pickup instructions for test.  Letter completed and left at front desk for pt to pick up when comes for lab work.

## 2018-04-06 NOTE — Telephone Encounter (Signed)
Contacted scheduling still waiting on Pre-Cert but will check on status and see if we can move it along. Made pt aware no additional questions at this time.

## 2018-04-06 NOTE — Addendum Note (Signed)
Addended by: Newt Minion on: 04/06/2018 05:02 PM   Modules accepted: Orders

## 2018-04-07 DIAGNOSIS — R0609 Other forms of dyspnea: Secondary | ICD-10-CM | POA: Diagnosis not present

## 2018-04-07 DIAGNOSIS — Z01812 Encounter for preprocedural laboratory examination: Secondary | ICD-10-CM | POA: Diagnosis not present

## 2018-04-07 LAB — BASIC METABOLIC PANEL
BUN / CREAT RATIO: 21 (ref 12–28)
BUN: 14 mg/dL (ref 8–27)
CHLORIDE: 102 mmol/L (ref 96–106)
CO2: 24 mmol/L (ref 20–29)
Calcium: 9.8 mg/dL (ref 8.7–10.3)
Creatinine, Ser: 0.68 mg/dL (ref 0.57–1.00)
GFR, EST AFRICAN AMERICAN: 100 mL/min/{1.73_m2} (ref >59)
GFR, EST NON AFRICAN AMERICAN: 87 mL/min/{1.73_m2} (ref >59)
Glucose: 162 mg/dL — ABNORMAL HIGH (ref 65–99)
Potassium: 4.2 mmol/L (ref 3.5–5.2)
SODIUM: 141 mmol/L (ref 134–144)

## 2018-04-09 ENCOUNTER — Encounter: Payer: Medicare HMO | Admitting: *Deleted

## 2018-04-09 ENCOUNTER — Ambulatory Visit (HOSPITAL_COMMUNITY)
Admission: RE | Admit: 2018-04-09 | Discharge: 2018-04-09 | Disposition: A | Discharge status: Home / Self Care | Payer: Medicare HMO | Source: Ambulatory Visit | Attending: Cardiology | Admitting: Cardiology

## 2018-04-09 DIAGNOSIS — Z006 Encounter for examination for normal comparison and control in clinical research program: Secondary | ICD-10-CM

## 2018-04-09 DIAGNOSIS — I2510 Atherosclerotic heart disease of native coronary artery without angina pectoris: Secondary | ICD-10-CM | POA: Diagnosis not present

## 2018-04-09 DIAGNOSIS — R918 Other nonspecific abnormal finding of lung field: Secondary | ICD-10-CM | POA: Insufficient documentation

## 2018-04-09 DIAGNOSIS — R0609 Other forms of dyspnea: Secondary | ICD-10-CM | POA: Insufficient documentation

## 2018-04-09 DIAGNOSIS — I700 Atherosclerosis of aorta: Secondary | ICD-10-CM | POA: Diagnosis not present

## 2018-04-09 DIAGNOSIS — R0600 Dyspnea, unspecified: Secondary | ICD-10-CM | POA: Diagnosis not present

## 2018-04-09 MED ORDER — NITROGLYCERIN 0.4 MG SL SUBL
SUBLINGUAL_TABLET | SUBLINGUAL | Status: AC
  Filled 2018-04-09: qty 1

## 2018-04-09 MED ORDER — METOPROLOL TARTRATE 5 MG/5ML IV SOLN
INTRAVENOUS | Status: AC
  Filled 2018-04-09: qty 5

## 2018-04-09 MED ORDER — NITROGLYCERIN 0.4 MG SL SUBL
0.8000 mg | SUBLINGUAL_TABLET | Freq: Once | SUBLINGUAL | Status: AC
  Administered 2018-04-09: 0.8 mg via SUBLINGUAL

## 2018-04-09 MED ORDER — METOPROLOL TARTRATE 5 MG/5ML IV SOLN
5.0000 mg | INTRAVENOUS | Status: DC | PRN
  Administered 2018-04-09 (×4): 5 mg via INTRAVENOUS

## 2018-04-09 MED ORDER — IOPAMIDOL (ISOVUE-370) INJECTION 76%
100.0000 mL | Freq: Once | INTRAVENOUS | Status: AC | PRN
  Administered 2018-04-09: 80 mL via INTRAVENOUS

## 2018-04-09 MED ORDER — NITROGLYCERIN 0.4 MG SL SUBL
SUBLINGUAL_TABLET | SUBLINGUAL | Status: AC
  Filled 2018-04-09: qty 2

## 2018-04-09 NOTE — Progress Notes (Signed)
CT complete. Patient denies any complaints. Eating and drinking at this time.

## 2018-04-09 NOTE — Research (Signed)
Subject Name: Christina Melendez  Subject met inclusion and exclusion criteria.  The informed consent form, study requirements and expectations were reviewed with the subject and questions and concerns were addressed prior to the signing of the consent form.  The subject verbalized understanding of the trial requirements.  The subject agreed to participate in the CADFEM trial and signed the informed consent  on 04/09/18.  The informed consent was obtained prior to performance of any protocol-specific procedures for the subject.  A copy of the signed informed consent was given to the subject and a copy was placed in the subject's medical record.   [name] [name]

## 2018-04-14 ENCOUNTER — Other Ambulatory Visit: Payer: Self-pay

## 2018-04-14 ENCOUNTER — Encounter (HOSPITAL_COMMUNITY): Payer: Self-pay

## 2018-04-14 ENCOUNTER — Emergency Department (HOSPITAL_COMMUNITY)
Admission: EM | Admit: 2018-04-14 | Discharge: 2018-04-15 | Disposition: A | Discharge status: Home / Self Care | Payer: Medicare HMO | Attending: Emergency Medicine | Admitting: Emergency Medicine

## 2018-04-14 DIAGNOSIS — K5732 Diverticulitis of large intestine without perforation or abscess without bleeding: Secondary | ICD-10-CM | POA: Diagnosis not present

## 2018-04-14 DIAGNOSIS — Z79899 Other long term (current) drug therapy: Secondary | ICD-10-CM | POA: Insufficient documentation

## 2018-04-14 DIAGNOSIS — F419 Anxiety disorder, unspecified: Secondary | ICD-10-CM | POA: Insufficient documentation

## 2018-04-14 DIAGNOSIS — F329 Major depressive disorder, single episode, unspecified: Secondary | ICD-10-CM | POA: Insufficient documentation

## 2018-04-14 DIAGNOSIS — Z7982 Long term (current) use of aspirin: Secondary | ICD-10-CM | POA: Insufficient documentation

## 2018-04-14 DIAGNOSIS — R1032 Left lower quadrant pain: Secondary | ICD-10-CM | POA: Diagnosis not present

## 2018-04-14 DIAGNOSIS — R109 Unspecified abdominal pain: Secondary | ICD-10-CM | POA: Diagnosis present

## 2018-04-14 LAB — COMPREHENSIVE METABOLIC PANEL
ALT: 16 U/L (ref 0–44)
ANION GAP: 9 (ref 5–15)
AST: 21 U/L (ref 15–41)
Albumin: 3.7 g/dL (ref 3.5–5.0)
Alkaline Phosphatase: 109 U/L (ref 38–126)
BILIRUBIN TOTAL: 0.3 mg/dL (ref 0.3–1.2)
BUN: 17 mg/dL (ref 8–23)
CO2: 27 mmol/L (ref 22–32)
Calcium: 10.1 mg/dL (ref 8.9–10.3)
Chloride: 102 mmol/L (ref 98–111)
Creatinine, Ser: 0.74 mg/dL (ref 0.44–1.00)
GFR calc Af Amer: >60 mL/min (ref >60)
GFR calc non Af Amer: >60 mL/min (ref >60)
Glucose, Bld: 113 mg/dL — ABNORMAL HIGH (ref 70–99)
POTASSIUM: 4.1 mmol/L (ref 3.5–5.1)
Sodium: 138 mmol/L (ref 135–145)
TOTAL PROTEIN: 6.9 g/dL (ref 6.5–8.1)

## 2018-04-14 LAB — CBC
HCT: 41.4 % (ref 36.0–46.0)
HEMOGLOBIN: 13.4 g/dL (ref 12.0–15.0)
MCH: 30.6 pg (ref 26.0–34.0)
MCHC: 32.4 g/dL (ref 30.0–36.0)
MCV: 94.5 fL (ref 80.0–100.0)
Platelets: 259 10*3/uL (ref 150–400)
RBC: 4.38 MIL/uL (ref 3.87–5.11)
RDW: 13.5 % (ref 11.5–15.5)
WBC: 9.2 10*3/uL (ref 4.0–10.5)
nRBC: 0 % (ref 0.0–0.2)

## 2018-04-14 LAB — URINALYSIS, ROUTINE W REFLEX MICROSCOPIC
Bilirubin Urine: NEGATIVE
GLUCOSE, UA: NEGATIVE mg/dL
KETONES UR: NEGATIVE mg/dL
Nitrite: NEGATIVE
PH: 5 (ref 5.0–8.0)
Protein, ur: NEGATIVE mg/dL
Specific Gravity, Urine: 1.016 (ref 1.005–1.030)

## 2018-04-14 LAB — LIPASE, BLOOD: Lipase: 36 U/L (ref 11–51)

## 2018-04-15 ENCOUNTER — Emergency Department (HOSPITAL_COMMUNITY): Payer: Medicare HMO

## 2018-04-15 ENCOUNTER — Encounter (HOSPITAL_COMMUNITY): Payer: Self-pay

## 2018-04-15 DIAGNOSIS — K5732 Diverticulitis of large intestine without perforation or abscess without bleeding: Secondary | ICD-10-CM | POA: Diagnosis not present

## 2018-04-15 MED ORDER — IOPAMIDOL (ISOVUE-300) INJECTION 61%
100.0000 mL | Freq: Once | INTRAVENOUS | Status: AC | PRN
  Administered 2018-04-15: 100 mL via INTRAVENOUS

## 2018-04-15 MED ORDER — ONDANSETRON HCL 4 MG/2ML IJ SOLN
4.0000 mg | Freq: Once | INTRAMUSCULAR | Status: AC
  Administered 2018-04-15: 4 mg via INTRAVENOUS
  Filled 2018-04-15: qty 2

## 2018-04-15 MED ORDER — SODIUM CHLORIDE 0.9 % IV BOLUS
500.0000 mL | Freq: Once | INTRAVENOUS | Status: AC
  Administered 2018-04-15: 500 mL via INTRAVENOUS

## 2018-04-15 MED ORDER — METRONIDAZOLE 500 MG PO TABS
500.0000 mg | ORAL_TABLET | Freq: Once | ORAL | Status: AC
  Administered 2018-04-15: 500 mg via ORAL
  Filled 2018-04-15: qty 1

## 2018-04-15 MED ORDER — CIPROFLOXACIN HCL 500 MG PO TABS: 500.0000 mg | ORAL_TABLET | Freq: Two times a day (BID) | ORAL | 0 refills | Status: DC

## 2018-04-15 MED ORDER — CIPROFLOXACIN HCL 500 MG PO TABS
500.0000 mg | ORAL_TABLET | Freq: Once | ORAL | Status: AC
  Administered 2018-04-15: 500 mg via ORAL
  Filled 2018-04-15: qty 1

## 2018-04-15 MED ORDER — MORPHINE SULFATE (PF) 4 MG/ML IV SOLN
4.0000 mg | Freq: Once | INTRAVENOUS | Status: AC
  Administered 2018-04-15: 4 mg via INTRAVENOUS
  Filled 2018-04-15: qty 1

## 2018-04-15 MED ORDER — IBUPROFEN 600 MG PO TABS: 600.0000 mg | ORAL_TABLET | Freq: Three times a day (TID) | ORAL | 0 refills | Status: DC | PRN

## 2018-04-15 MED ORDER — IOPAMIDOL (ISOVUE-300) INJECTION 61%
INTRAVENOUS | Status: AC
  Filled 2018-04-15: qty 100

## 2018-04-15 MED ORDER — METRONIDAZOLE 500 MG PO TABS: 500.0000 mg | ORAL_TABLET | Freq: Three times a day (TID) | ORAL | 0 refills | Status: DC

## 2018-04-15 MED ORDER — SODIUM CHLORIDE (PF) 0.9 % IJ SOLN
INTRAMUSCULAR | Status: AC
  Filled 2018-04-15: qty 50
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
## 2018-04-20 ENCOUNTER — Encounter: Payer: Self-pay | Admitting: Adult Health

## 2018-04-20 ENCOUNTER — Ambulatory Visit: Payer: Medicare HMO | Admitting: Adult Health

## 2018-04-20 VITALS — BP 122/66 | HR 103 | Ht 64.0 in | Wt 182.0 lb

## 2018-04-20 DIAGNOSIS — R0609 Other forms of dyspnea: Secondary | ICD-10-CM

## 2018-04-20 DIAGNOSIS — R011 Cardiac murmur, unspecified: Secondary | ICD-10-CM

## 2018-04-20 NOTE — Patient Instructions (Signed)
Follow-Up: You will need a follow up appointment in 3 MONTHS.  You may see  DR Hoover Brunette, DNP, AACC -or- one of the following Advanced Practice Providers on your designated Care Team:  Kerin Ransom, [place]  Medication Instructions:  NO CHANGES- Your physician recommends that you continue on your current medications as directed. Please refer to the Current Medication list given to you today.  If you need a refill on your cardiac medications before your next appointment, please call your pharmacy.  Labwork: If you have labs (blood work) drawn today and your tests are completely normal, you will receive your results ONLY by: . MyChart Message (if you have MyChart) -OR- . A paper copy in the mail  At [facility], you and your health needs are our priority.  As part of our continuing mission to provide you with exceptional heart care, we have created designated Provider Care Teams.  These Care Teams include your primary Cardiologist (physician) and Advanced Practice Providers (APPs -  Physician Assistants and Nurse Practitioners) who all work together to provide you with the care you need, when you need it.  Thank you for choosing [facility] at [facility]!!

## 2018-04-24 DIAGNOSIS — K259 Gastric ulcer, unspecified as acute or chronic, without hemorrhage or perforation: Secondary | ICD-10-CM | POA: Diagnosis not present

## 2018-04-24 DIAGNOSIS — K5732 Diverticulitis of large intestine without perforation or abscess without bleeding: Secondary | ICD-10-CM | POA: Diagnosis not present

## 2018-05-31 DIAGNOSIS — L309 Dermatitis, unspecified: Secondary | ICD-10-CM | POA: Diagnosis not present

## 2018-06-15 DIAGNOSIS — F411 Generalized anxiety disorder: Secondary | ICD-10-CM | POA: Diagnosis not present

## 2018-06-15 DIAGNOSIS — F3342 Major depressive disorder, recurrent, in full remission: Secondary | ICD-10-CM | POA: Diagnosis not present

## 2018-06-15 DIAGNOSIS — G4700 Insomnia, unspecified: Secondary | ICD-10-CM | POA: Diagnosis not present

## 2018-06-18 DIAGNOSIS — M1711 Unilateral primary osteoarthritis, right knee: Secondary | ICD-10-CM | POA: Diagnosis not present

## 2018-06-18 DIAGNOSIS — M170 Bilateral primary osteoarthritis of knee: Secondary | ICD-10-CM | POA: Diagnosis not present

## 2018-06-18 DIAGNOSIS — M1612 Unilateral primary osteoarthritis, left hip: Secondary | ICD-10-CM | POA: Diagnosis not present

## 2018-06-18 DIAGNOSIS — M1712 Unilateral primary osteoarthritis, left knee: Secondary | ICD-10-CM | POA: Diagnosis not present

## 2018-06-19 DIAGNOSIS — G894 Chronic pain syndrome: Secondary | ICD-10-CM | POA: Diagnosis not present

## 2018-06-19 DIAGNOSIS — M25551 Pain in right hip: Secondary | ICD-10-CM | POA: Diagnosis not present

## 2018-06-19 DIAGNOSIS — M25552 Pain in left hip: Secondary | ICD-10-CM | POA: Diagnosis not present

## 2018-07-07 DIAGNOSIS — H52203 Unspecified astigmatism, bilateral: Secondary | ICD-10-CM | POA: Diagnosis not present

## 2018-07-07 DIAGNOSIS — H04122 Dry eye syndrome of left lacrimal gland: Secondary | ICD-10-CM | POA: Diagnosis not present

## 2018-07-07 DIAGNOSIS — H524 Presbyopia: Secondary | ICD-10-CM | POA: Diagnosis not present

## 2018-07-21 ENCOUNTER — Encounter: Payer: Self-pay | Admitting: Cardiovascular Disease

## 2018-07-21 ENCOUNTER — Ambulatory Visit: Payer: Medicare HMO | Admitting: Cardiovascular Disease

## 2018-07-21 DIAGNOSIS — R011 Cardiac murmur, unspecified: Secondary | ICD-10-CM | POA: Diagnosis not present

## 2018-07-21 DIAGNOSIS — R0609 Other forms of dyspnea: Secondary | ICD-10-CM

## 2018-07-21 NOTE — Progress Notes (Signed)
07/21/2018 Christina Melendez   10/23/1945  [id number]  Primary Physician [name], FNP Primary Cardiologist: Lorretta Harp MD Garret Reddish, [place], [place]  HPI:  Christina Melendez is a 73 y.o.  moderately overweight married Caucasian female with no children referred by Dr. Lynelle Doctor for cardiovascular evaluation because of new onset dyspnea.  S last saw her in the office 03/03/2018.  He is accompanied by her husband Christina Melendez today.  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  She has no cardiovascular risk factors.  She walks approximately a mile a day.  There is no family history of heart disease.  She is never had a heart attack or stroke.  She has never smoked.  She is noticed increasing dyspnea the last 6 months especially when walking around the house.  She specifically denies chest pain.  She does have a soft outflow tract murmur by auscultation.  As I saw her I did get a 2D echo 03/23/2018 that showed normal LV systolic function, a mild dynamic outflow tract gradient and a coronary CTA performed 04/09/2018 that showed mild nonobstructive disease with a coronary calcium score of 17.  Her symptoms and interestingly have improved since I last saw her.  She has begun exercise.   Current Meds  Medication Sig  . aspirin 81 MG tablet Take 81 mg by mouth daily.  Marland Kitchen CALCIUM PO Take 1,200 mg by mouth daily.  . ciprofloxacin (CIPRO) 500 MG tablet Take 1 tablet (500 mg total) by mouth 2 (two) times daily.  Marland Kitchen estradiol (ESTRACE) 1 MG tablet Take 1 mg by mouth daily.  Marland Kitchen FLUoxetine (PROZAC) 20 MG capsule Take 20 mg by mouth daily.  Marland Kitchen ibuprofen (ADVIL,MOTRIN) 600 MG tablet Take 1 tablet (600 mg total) by mouth every 8 (eight) hours as needed for moderate pain.  . metroNIDAZOLE (FLAGYL) 500 MG tablet Take 1 tablet (500 mg total) by mouth 3 (three) times daily.  . Omega 3 1000 MG CAPS Take 2,000 mg by mouth 2 (two) times daily.  . ondansetron (ZOFRAN) 4 MG tablet  Take 1 tablet by mouth every 6 (six) hours as needed for nausea.  . temazepam (RESTORIL) 30 MG capsule Take 30 mg by mouth at bedtime.  . traMADol (ULTRAM) 50 MG tablet Take 50 mg by mouth every 12 (twelve) hours as needed for moderate pain.     No Known Allergies  Social History   Socioeconomic History  . Marital status: Single    Spouse name: Not on file  . Number of children: Not on file  . Years of education: Not on file  . Highest education level: Not on file  Occupational History  . Not on file  Social Needs  . Financial resource strain: Not on file  . Food insecurity:    Worry: Not on file    Inability: Not on file  . Transportation needs:    Medical: Not on file    Non-medical: Not on file  Tobacco Use  . Smoking status: Never Smoker  . Smokeless tobacco: Never Used  Substance and Sexual Activity  . Alcohol use: Not Currently  . Drug use: No  . Sexual activity: Not Currently  Lifestyle  . Physical activity:    Days per week: Not on file    Minutes per session: Not on file  . Stress: Not on file  Relationships  . Social connections:    Talks on phone: Not on file  Gets together: Not on file    Attends religious service: Not on file    Active member of club or organization: Not on file    Attends meetings of clubs or organizations: Not on file    Relationship status: Not on file  . Intimate partner violence:    Fear of current or ex partner: Not on file    Emotionally abused: Not on file    Physically abused: Not on file    Forced sexual activity: Not on file  Other Topics Concern  . Not on file  Social History Narrative   ** Merged History Encounter **         Review of Systems: General: negative for chills, fever, night sweats or weight changes.  Cardiovascular: negative for chest pain, dyspnea on exertion, edema, orthopnea, palpitations, paroxysmal nocturnal dyspnea or shortness of breath Dermatological: negative for rash Respiratory: negative  for cough or wheezing Urologic: negative for hematuria Abdominal: negative for nausea, vomiting, diarrhea, bright red blood per rectum, melena, or hematemesis Neurologic: negative for visual changes, syncope, or dizziness All other systems reviewed and are otherwise negative except as noted above.    Blood pressure 132/70, pulse 96, height 5\' 4"  (1.626 m), weight 177 lb 6.4 oz (80.5 kg).  General appearance: alert and no distress Neck: no adenopathy, no carotid bruit, no JVD, supple, symmetrical, trachea midline and thyroid not enlarged, symmetric, no tenderness/mass/nodules Lungs: clear to auscultation bilaterally Heart: Soft outflow tract murmur Extremities: extremities normal, atraumatic, no cyanosis or edema Pulses: 2+ and symmetric Skin: Skin color, texture, turgor normal. No rashes or lesions Neurologic: Alert and oriented X 3, normal strength and tone. Normal symmetric reflexes. Normal coordination and gait  EKG not performed today  ASSESSMENT AND PLAN:   Dyspnea on exertion History of dyspnea on exertion improved since I last saw her back in October.  She did have a 2D echo that revealed normal LV systolic function with some mild dynamic outflow tract obstruction but no valvular abnormalities.  This probably was responsible for her outflow tract murmur.  For coronary CTA showed nonobstructive disease with a coronary calcium score of 17.  Cardiac murmur 2/6 outflow tract murmur with echo performed 03/23/2018 that revealed normal LV systolic function with a mild outflow tract gradient consistent with dynamic outflow tract obstruction.      Lorretta Harp MD FACP,FACC,FAHA, [facility] 07/21/2018 11:00 AM

## 2018-07-21 NOTE — Assessment & Plan Note (Signed)
2/6 outflow tract murmur with echo performed 03/23/2018 that revealed normal LV systolic function with a mild outflow tract gradient consistent with dynamic outflow tract obstruction.

## 2018-07-21 NOTE — Assessment & Plan Note (Addendum)
History of dyspnea on exertion improved since I last saw her back in October.  She did have a 2D echo that revealed normal LV systolic function with some mild dynamic outflow tract obstruction but no valvular abnormalities.  This probably was responsible for her outflow tract murmur.  For coronary CTA showed nonobstructive disease with a coronary calcium score of 17.

## 2018-07-21 NOTE — Patient Instructions (Addendum)
Medication Instructions:  Dr [name] recommends that you continue on your current medications as directed. Please refer to the Current Medication list given to you today.  If you need a refill on your cardiac medications before your next appointment, please call your pharmacy.   Follow-Up: At [facility], you and your health needs are our priority.  As part of our continuing mission to provide you with exceptional heart care, we have created designated Provider Care Teams.  These Care Teams include your primary Cardiologist (physician) and Advanced Practice Providers (APPs -  Physician Assistants and Nurse Practitioners) who all work together to provide you with the care you need, when you need it. You will need a follow up appointment in 12 months.  Please call our office 2 months in advance to schedule this appointment.  You may see [name], MD or one of the following Advanced Practice Providers on your designated Care Team:   Kerin Ransom, PA-C Roby Lofts, [place] . Sande Rives, PA-C . You will receive a reminder letter in the mail two months in advance. If you don't receive a letter, please call our office to schedule the follow-up appointment.

## 2018-08-17 DIAGNOSIS — K5904 Chronic idiopathic constipation: Secondary | ICD-10-CM | POA: Diagnosis not present

## 2018-08-17 DIAGNOSIS — K259 Gastric ulcer, unspecified as acute or chronic, without hemorrhage or perforation: Secondary | ICD-10-CM | POA: Diagnosis not present

## 2018-08-17 DIAGNOSIS — K219 Gastro-esophageal reflux disease without esophagitis: Secondary | ICD-10-CM | POA: Diagnosis not present

## 2018-08-19 DIAGNOSIS — M160 Bilateral primary osteoarthritis of hip: Secondary | ICD-10-CM | POA: Diagnosis not present

## 2018-11-06 DIAGNOSIS — H6981 Other specified disorders of Eustachian tube, right ear: Secondary | ICD-10-CM | POA: Diagnosis not present

## 2018-11-06 DIAGNOSIS — H6983 Other specified disorders of Eustachian tube, bilateral: Secondary | ICD-10-CM | POA: Diagnosis not present

## 2018-11-11 ENCOUNTER — Telehealth: Payer: Self-pay | Admitting: Adult Health

## 2018-11-11 NOTE — Telephone Encounter (Signed)
smartphone/ my chart active/ consent/ pre reg completed  °
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
## 2018-11-17 ENCOUNTER — Telehealth (INDEPENDENT_AMBULATORY_CARE_PROVIDER_SITE_OTHER): Payer: Medicare HMO | Admitting: Adult Health

## 2018-11-17 VITALS — Ht 64.0 in | Wt 170.0 lb

## 2018-11-17 DIAGNOSIS — I2510 Atherosclerotic heart disease of native coronary artery without angina pectoris: Secondary | ICD-10-CM

## 2018-11-17 DIAGNOSIS — R0609 Other forms of dyspnea: Secondary | ICD-10-CM

## 2018-11-17 NOTE — Patient Instructions (Signed)
Medication Instructions:  NO CHANGES- Your physician recommends that you continue on your current medications as directed. Please refer to the Current Medication list given to you today.  If you need a refill on your cardiac medications before your next appointment, please call your pharmacy.  Testing/Procedures: Echocardiogram - Your physician has requested that you have an echocardiogram. Echocardiography is a painless test that uses sound waves to create images of your heart. It provides your doctor with information about the size and shape of your heart and how well your heart's chambers and valves are working. This procedure takes approximately one hour. There are no restrictions for this procedure. This will be performed at our [facility] location - [address].  Your physician has recommended that you have a pulmonary function test. Pulmonary Function Tests are a group of tests that measure how well air moves in and out of your lungs.  Follow-Up: You will need a follow up appointment [place]  [name], MD  , Jory Sims, DNP, AACC  or one of the following Advanced Practice Providers on your designated Care Team:  Kerin Ransom, PA-C Roby Lofts, PA-C Sande Rives, [place]     At [facility], you and your health needs are our priority.  As part of our continuing mission to provide you with exceptional heart care, we have created designated Provider Care Teams.  These Care Teams include your primary Cardiologist (physician) and Advanced Practice Providers (APPs -  Physician Assistants and Nurse Practitioners) who all work together to provide you with the care you need, when you need it.  Thank you for choosing [facility] at [facility]!!

## 2018-11-27 ENCOUNTER — Telehealth: Payer: Self-pay | Admitting: Adult Health

## 2018-11-27 NOTE — Telephone Encounter (Signed)
lmtcb - pt needs fu appt after PFTs and echo. Please schedule with Arnold Long.

## 2018-11-28 IMAGING — CT CT ABD-PELV W/ CM
2 of 5 series · 15 of 46 positions shown, 17 images · IV contrast (ISOVUE)
Comparison: None.

CLINICAL DATA: LEFT lower quadrant pain for 5 weeks. History of
hysterectomy.

EXAM:
CT ABDOMEN AND PELVIS WITH CONTRAST
TECHNIQUE: Multidetector CT imaging of the abdomen and pelvis was performed
using the standard protocol following bolus administration of
intravenous contrast.
CONTRAST:  100mL 1BRBQK-NBB IOPAMIDOL (1BRBQK-NBB) INJECTION 61%

[Series 2: axial st · axial · 0.68mm/px · z∈[+1118,+1518]mm · 12 of 92 slices shown, 14 images]
[im 6/92  soft-tissue]
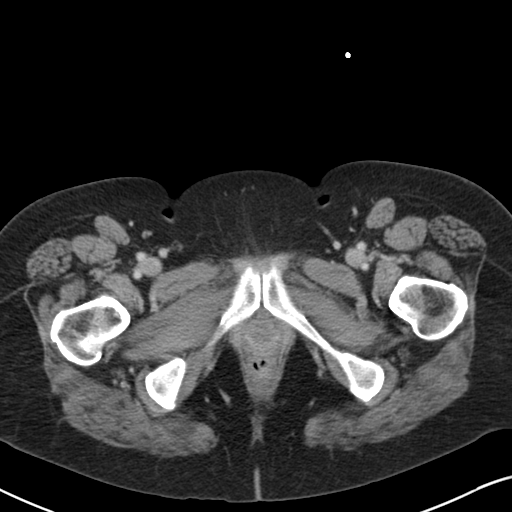
[im 6/92  bone]
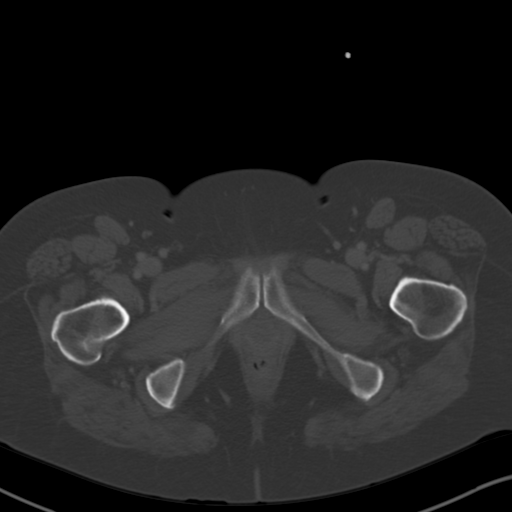
[im 12/92  soft-tissue]
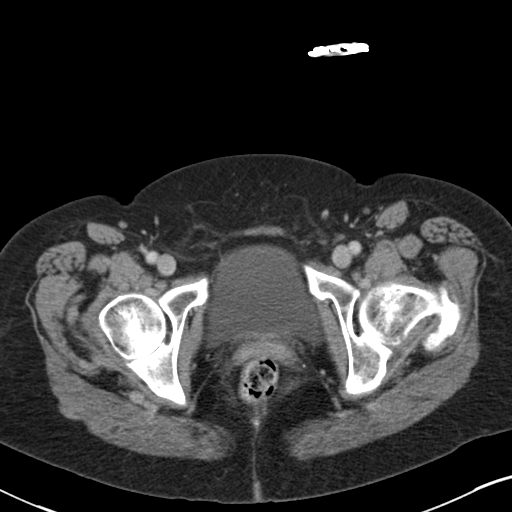
[im 23/92  soft-tissue]
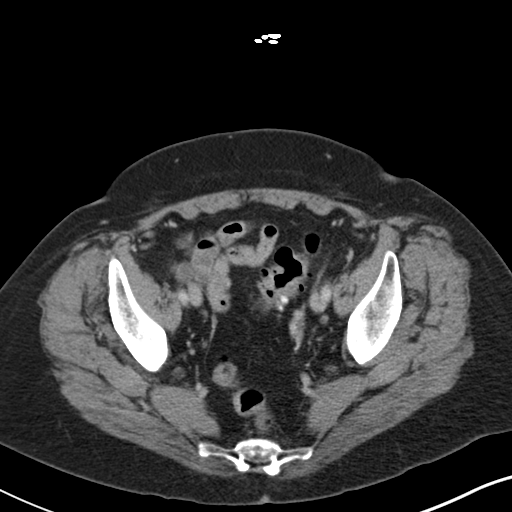
[im 29/92  soft-tissue]
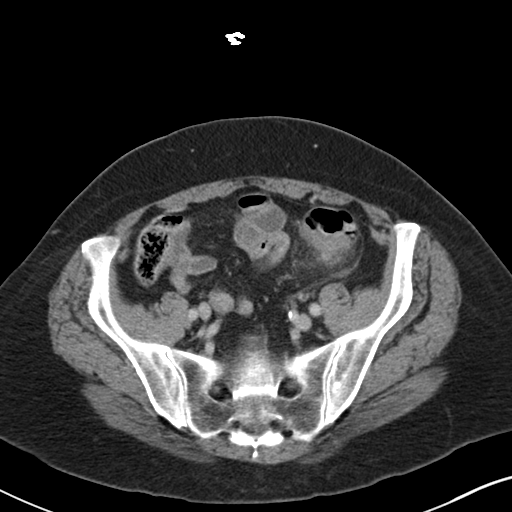
[im 35/92  soft-tissue]
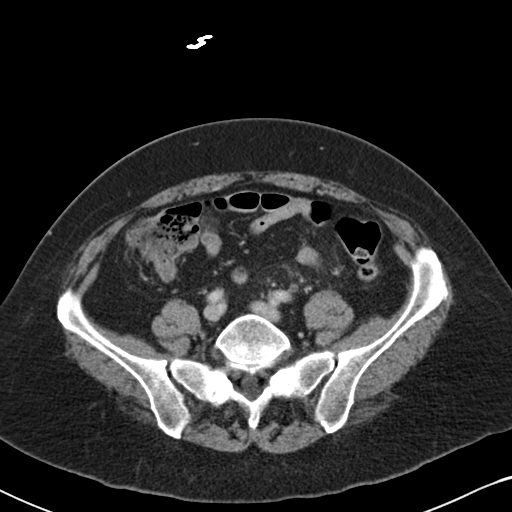
[im 40/92  soft-tissue]
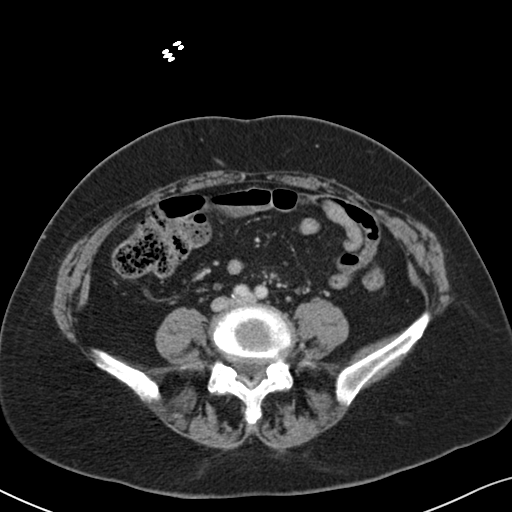
[im 52/92  soft-tissue]
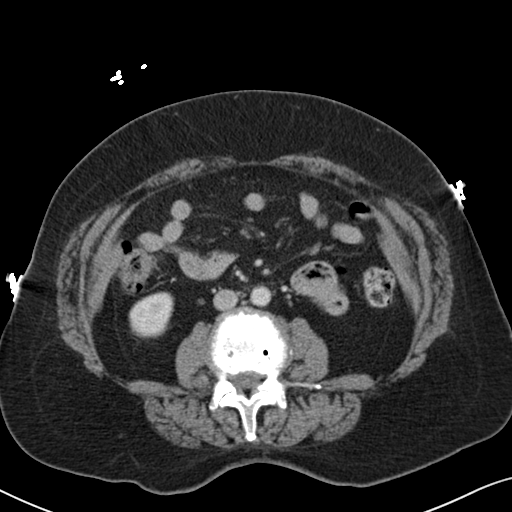
[im 57/92  soft-tissue]
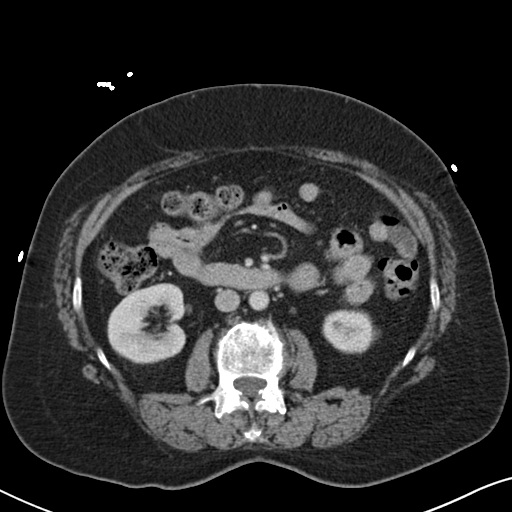
[im 63/92  soft-tissue]
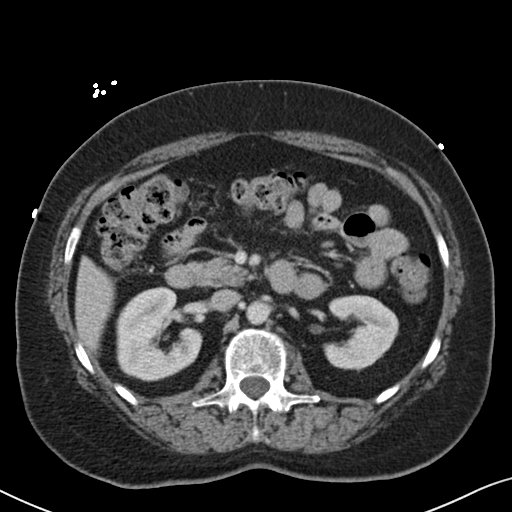
[im 63/92  bone]
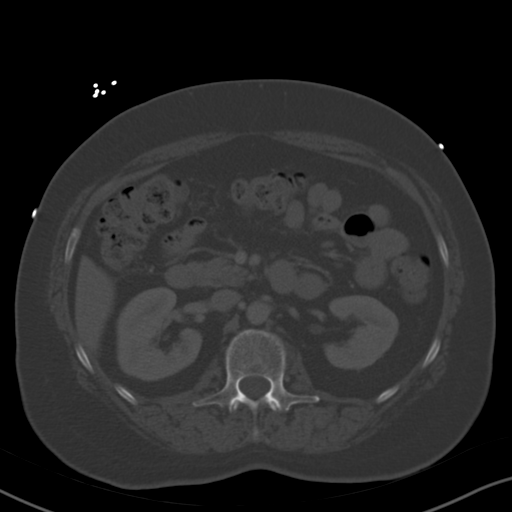
[im 69/92  soft-tissue]
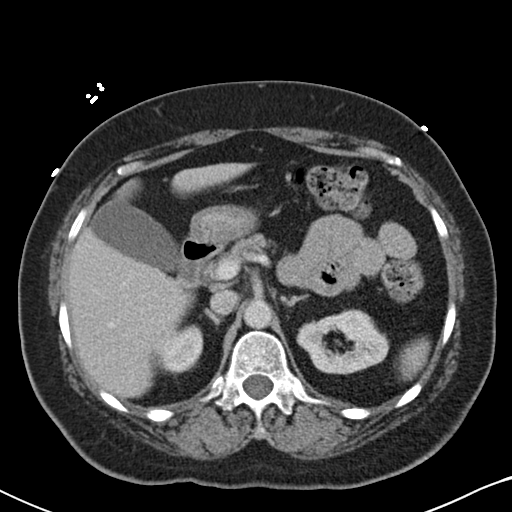
[im 80/92  soft-tissue]
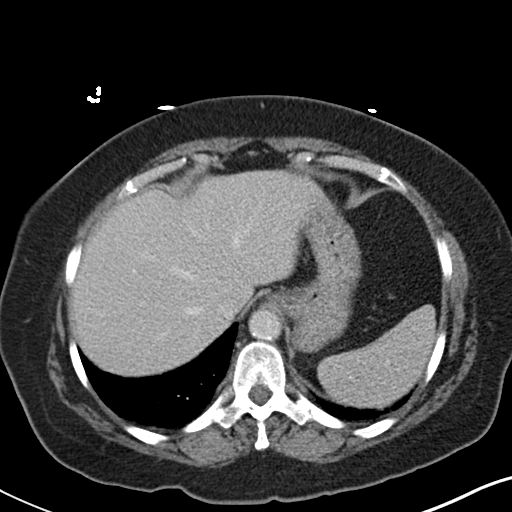
[im 86/92  soft-tissue]
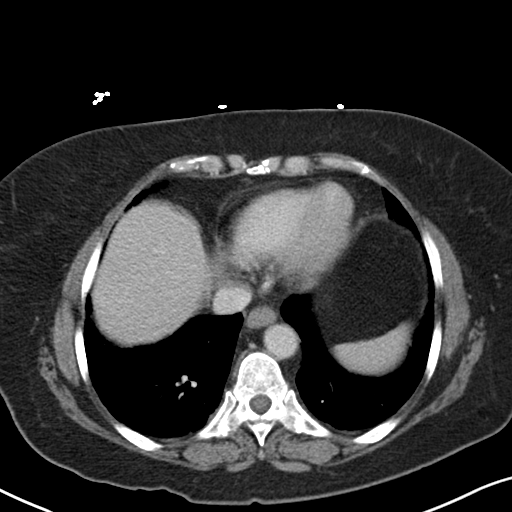

[Series 4: coronal st · coronal · 0.62mm/px · 3 of 88 slices shown]
[im 30/88  soft-tissue]
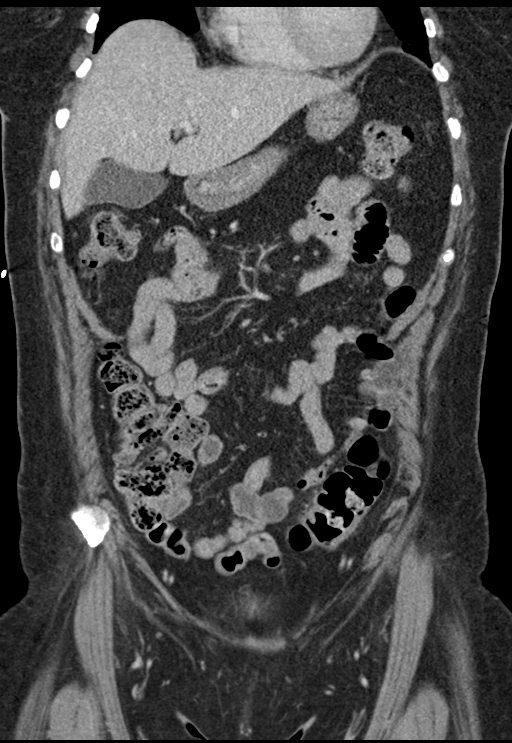
[im 39/88  soft-tissue]
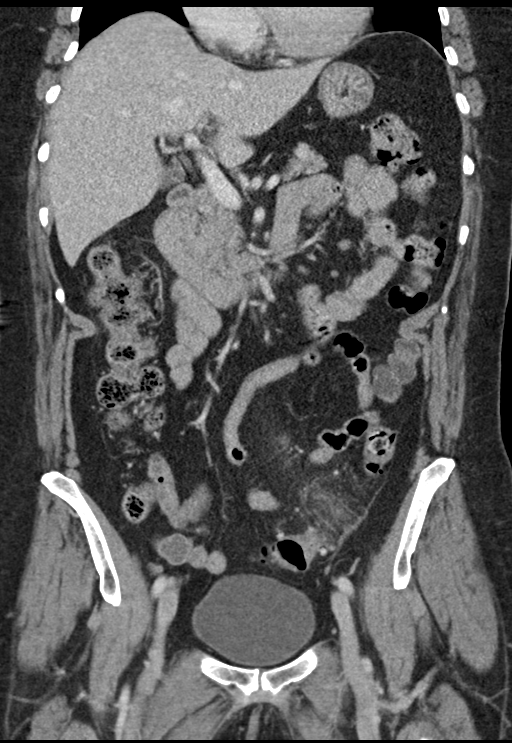
[im 49/88  soft-tissue]
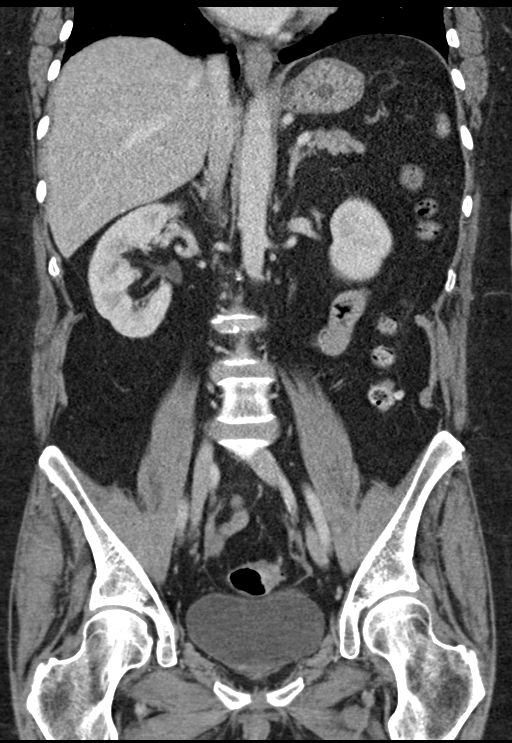

[15 of 46 positions shown; findings below may reference images not displayed]

FINDINGS: LOWER CHEST: Dependent atelectasis. Included heart size is normal.
No pericardial effusion.

HEPATOBILIARY: Liver and gallbladder are normal.

PANCREAS: Normal.

SPLEEN: Normal.

ADRENALS/URINARY TRACT: Kidneys are orthotopic, demonstrating
symmetric enhancement. No nephrolithiasis, hydronephrosis or solid
renal masses. 14 mm cyst upper pole LEFT kidney. Too small to
characterize hypodensity lower pole LEFT kidney. The unopacified
ureters are normal in course and caliber. Delayed imaging through
the kidneys demonstrates symmetric prompt contrast excretion within
the proximal urinary collecting system. Urinary bladder is
adequately distended and unremarkable. Normal adrenal glands.

STOMACH/BOWEL: Mild colonic diverticulosis with superimposed short
segment of eccentric proximal sigmoid colonic bowel wall thickening
and pericolonic inflammation. The stomach, small bowel are normal in
course and caliber without inflammatory changes. Mild amount of
retained large bowel stool. The appendix is not discretely
identified, however there are no inflammatory changes in the right
lower quadrant.

VASCULAR/LYMPHATIC: Aortoiliac vessels are normal in course and
caliber. Mild intimal thickening calcific atherosclerosis. No
lymphadenopathy by CT size criteria.

REPRODUCTIVE: Status post hysterectomy.

OTHER: Trace free fluid LEFT pericolic gutter.

MUSCULOSKELETAL: Nonacute. Severe L1-2 and L2-3 disc height loss and
endplate spurring compatible with degenerative discs.
IMPRESSION: 1. Acute sigmoid colon diverticulitis without complication.

Aortic Atherosclerosis ([id number]-XBY.Y).

## 2018-12-01 ENCOUNTER — Telehealth (HOSPITAL_COMMUNITY): Payer: Self-pay

## 2018-12-01 DIAGNOSIS — H6981 Other specified disorders of Eustachian tube, right ear: Secondary | ICD-10-CM | POA: Diagnosis not present

## 2018-12-01 NOTE — Telephone Encounter (Signed)

## 2018-12-02 ENCOUNTER — Inpatient Hospital Stay (HOSPITAL_COMMUNITY): Admission: RE | Admit: 2018-12-02 | Payer: Medicare HMO | Source: Ambulatory Visit

## 2018-12-02 ENCOUNTER — Other Ambulatory Visit (HOSPITAL_COMMUNITY): Payer: Medicare HMO

## 2018-12-02 ENCOUNTER — Telehealth: Payer: Self-pay | Admitting: *Deleted

## 2018-12-02 NOTE — Telephone Encounter (Signed)
Thank you for the information  KL

## 2018-12-02 NOTE — Telephone Encounter (Signed)
Received a call today from radiology, pt was schedule to have a PFT done today but a covid19 test was not ordered, so pt PFT was cancel, call pt back to schedule a date and time for her covid19 test so her PFT can be reschedule, pt stated that she will like to wait until this covid19 situation is over and done with, pt stated she will call back when she is ready to schedule PFT test.

## 2018-12-10 ENCOUNTER — Ambulatory Visit: Payer: Medicare HMO | Admitting: Adult Health

## 2018-12-11 ENCOUNTER — Other Ambulatory Visit: Payer: Self-pay

## 2018-12-11 ENCOUNTER — Ambulatory Visit (HOSPITAL_COMMUNITY): Payer: Medicare HMO | Attending: Adult Health

## 2018-12-11 DIAGNOSIS — M160 Bilateral primary osteoarthritis of hip: Secondary | ICD-10-CM | POA: Diagnosis not present

## 2018-12-11 DIAGNOSIS — G894 Chronic pain syndrome: Secondary | ICD-10-CM | POA: Diagnosis not present

## 2018-12-11 DIAGNOSIS — Z79891 Long term (current) use of opiate analgesic: Secondary | ICD-10-CM | POA: Diagnosis not present

## 2018-12-11 DIAGNOSIS — R0609 Other forms of dyspnea: Secondary | ICD-10-CM | POA: Diagnosis not present

## 2018-12-11 MED ORDER — PERFLUTREN LIPID MICROSPHERE
1.0000 mL | INTRAVENOUS | Status: AC | PRN
  Administered 2018-12-11: 2 mL via INTRAVENOUS

## 2018-12-15 ENCOUNTER — Telehealth: Payer: Self-pay | Admitting: *Deleted

## 2018-12-15 DIAGNOSIS — R0609 Other forms of dyspnea: Secondary | ICD-10-CM

## 2018-12-15 DIAGNOSIS — F3342 Major depressive disorder, recurrent, in full remission: Secondary | ICD-10-CM | POA: Diagnosis not present

## 2018-12-15 DIAGNOSIS — F411 Generalized anxiety disorder: Secondary | ICD-10-CM | POA: Diagnosis not present

## 2018-12-15 DIAGNOSIS — G4700 Insomnia, unspecified: Secondary | ICD-10-CM | POA: Diagnosis not present

## 2018-12-15 DIAGNOSIS — K219 Gastro-esophageal reflux disease without esophagitis: Secondary | ICD-10-CM | POA: Diagnosis not present

## 2018-12-15 NOTE — Telephone Encounter (Signed)
Referral send to pulmonologist

## 2018-12-16 ENCOUNTER — Ambulatory Visit: Payer: Medicare HMO | Admitting: Adult Health

## 2018-12-16 ENCOUNTER — Telehealth: Payer: Self-pay | Admitting: *Deleted

## 2018-12-16 NOTE — Telephone Encounter (Signed)
Spoke with patient regarding appointemnt with [facility]--Dr. Mannam scheduled for Friday 01/01/19 at 9:15 am---address is [address], [address]--phone is [phone].   Patient voiced her understanding.

## 2018-12-17 DIAGNOSIS — M25562 Pain in left knee: Secondary | ICD-10-CM | POA: Diagnosis not present

## 2018-12-17 DIAGNOSIS — M170 Bilateral primary osteoarthritis of knee: Secondary | ICD-10-CM | POA: Diagnosis not present

## 2018-12-17 DIAGNOSIS — M25561 Pain in right knee: Secondary | ICD-10-CM | POA: Diagnosis not present

## 2018-12-22 ENCOUNTER — Other Ambulatory Visit: Payer: Self-pay

## 2018-12-22 DIAGNOSIS — Z7982 Long term (current) use of aspirin: Secondary | ICD-10-CM | POA: Diagnosis not present

## 2018-12-22 DIAGNOSIS — Z79899 Other long term (current) drug therapy: Secondary | ICD-10-CM | POA: Insufficient documentation

## 2018-12-22 DIAGNOSIS — R4182 Altered mental status, unspecified: Secondary | ICD-10-CM | POA: Diagnosis not present

## 2018-12-22 DIAGNOSIS — R112 Nausea with vomiting, unspecified: Secondary | ICD-10-CM | POA: Diagnosis not present

## 2018-12-22 DIAGNOSIS — R1110 Vomiting, unspecified: Secondary | ICD-10-CM | POA: Diagnosis not present

## 2018-12-22 DIAGNOSIS — R4020 Unspecified coma: Secondary | ICD-10-CM | POA: Diagnosis not present

## 2018-12-23 ENCOUNTER — Emergency Department (HOSPITAL_COMMUNITY)
Admission: EM | Admit: 2018-12-23 | Discharge: 2018-12-23 | Disposition: A | Discharge status: Home / Self Care | Payer: Medicare HMO | Attending: Emergency Medicine | Admitting: Emergency Medicine

## 2018-12-23 ENCOUNTER — Emergency Department (HOSPITAL_COMMUNITY): Payer: Medicare HMO

## 2018-12-23 ENCOUNTER — Encounter (HOSPITAL_COMMUNITY): Payer: Self-pay

## 2018-12-23 DIAGNOSIS — R112 Nausea with vomiting, unspecified: Secondary | ICD-10-CM | POA: Diagnosis not present

## 2018-12-23 DIAGNOSIS — R4020 Unspecified coma: Secondary | ICD-10-CM | POA: Diagnosis not present

## 2018-12-23 DIAGNOSIS — R1110 Vomiting, unspecified: Secondary | ICD-10-CM | POA: Diagnosis not present

## 2018-12-23 LAB — URINALYSIS, ROUTINE W REFLEX MICROSCOPIC
Bilirubin Urine: NEGATIVE
Glucose, UA: NEGATIVE mg/dL
Hgb urine dipstick: NEGATIVE
Ketones, ur: NEGATIVE mg/dL
Leukocytes,Ua: NEGATIVE
Nitrite: NEGATIVE
Protein, ur: NEGATIVE mg/dL
Specific Gravity, Urine: >1.046 — ABNORMAL HIGH (ref 1.005–1.030)
pH: 5 (ref 5.0–8.0)

## 2018-12-23 LAB — COMPREHENSIVE METABOLIC PANEL
ALT: 17 U/L (ref 0–44)
AST: 21 U/L (ref 15–41)
Albumin: 3.9 g/dL (ref 3.5–5.0)
Alkaline Phosphatase: 84 U/L (ref 38–126)
Anion gap: 11 (ref 5–15)
BUN: 32 mg/dL — ABNORMAL HIGH (ref 8–23)
CO2: 24 mmol/L (ref 22–32)
Calcium: 10.4 mg/dL — ABNORMAL HIGH (ref 8.9–10.3)
Chloride: 102 mmol/L (ref 98–111)
Creatinine, Ser: 0.88 mg/dL (ref 0.44–1.00)
GFR calc Af Amer: >60 mL/min (ref >60)
GFR calc non Af Amer: >60 mL/min (ref >60)
Glucose, Bld: 151 mg/dL — ABNORMAL HIGH (ref 70–99)
Potassium: 4.2 mmol/L (ref 3.5–5.1)
Sodium: 137 mmol/L (ref 135–145)
Total Bilirubin: 0.8 mg/dL (ref 0.3–1.2)
Total Protein: 6.8 g/dL (ref 6.5–8.1)

## 2018-12-23 LAB — CBC
HCT: 43.8 % (ref 36.0–46.0)
Hemoglobin: 14.6 g/dL (ref 12.0–15.0)
MCH: 31.1 pg (ref 26.0–34.0)
MCHC: 33.3 g/dL (ref 30.0–36.0)
MCV: 93.4 fL (ref 80.0–100.0)
Platelets: 279 10*3/uL (ref 150–400)
RBC: 4.69 MIL/uL (ref 3.87–5.11)
RDW: 13.1 % (ref 11.5–15.5)
WBC: 18.3 10*3/uL — ABNORMAL HIGH (ref 4.0–10.5)
nRBC: 0 % (ref 0.0–0.2)

## 2018-12-23 LAB — CK: Total CK: 29 U/L — ABNORMAL LOW (ref 38–234)

## 2018-12-23 LAB — LACTIC ACID, PLASMA
Lactic Acid, Venous: 2.2 mmol/L (ref 0.5–1.9)
Lactic Acid, Venous: 1.7 mmol/L (ref 0.5–1.9)

## 2018-12-23 LAB — TROPONIN I (HIGH SENSITIVITY)
Troponin I (High Sensitivity): 11 ng/L (ref <18)
Troponin I (High Sensitivity): 11 ng/L (ref <18)

## 2018-12-23 LAB — LIPASE, BLOOD: Lipase: 34 U/L (ref 11–51)

## 2018-12-23 MED ORDER — ONDANSETRON 4 MG PO TBDP: 4.0000 mg | ORAL_TABLET | Freq: Three times a day (TID) | ORAL | 0 refills | Status: DC | PRN

## 2018-12-23 MED ORDER — ONDANSETRON HCL 4 MG/2ML IJ SOLN
4.0000 mg | Freq: Once | INTRAMUSCULAR | Status: AC | PRN
  Administered 2018-12-23: 4 mg via INTRAVENOUS
  Filled 2018-12-23: qty 2

## 2018-12-23 MED ORDER — SODIUM CHLORIDE 0.9 % IV BOLUS
1000.0000 mL | Freq: Once | INTRAVENOUS | Status: AC
  Administered 2018-12-23: 1000 mL via INTRAVENOUS

## 2018-12-23 MED ORDER — SODIUM CHLORIDE (PF) 0.9 % IJ SOLN
INTRAMUSCULAR | Status: AC
  Filled 2018-12-23: qty 50

## 2018-12-23 MED ORDER — ONDANSETRON 4 MG PO TBDP
4.0000 mg | ORAL_TABLET | Freq: Once | ORAL | Status: AC
  Administered 2018-12-23: 4 mg via ORAL
  Filled 2018-12-23: qty 1

## 2018-12-23 MED ORDER — IOHEXOL 300 MG/ML  SOLN
100.0000 mL | Freq: Once | INTRAMUSCULAR | Status: AC | PRN
  Administered 2018-12-23: 100 mL via INTRAVENOUS

## 2018-12-23 MED ORDER — SODIUM CHLORIDE 0.9% FLUSH: 3.0000 mL | Freq: Once | INTRAVENOUS | Status: DC
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
## 2018-12-23 NOTE — Discharge Instructions (Signed)
Your testing is reassuring.  There is no evidence of a heart attack.  Your white blood cell count was slightly elevated.  Your CT scan of your head and abdomen are negative. You left before urine test resulted.  Follow-up with your doctor today as scheduled and return to the ED if you develop new or worsening symptoms.

## 2019-01-01 ENCOUNTER — Institutional Professional Consult (permissible substitution): Payer: Medicare HMO | Admitting: Pulmonary Disease

## 2019-03-12 ENCOUNTER — Other Ambulatory Visit: Payer: Self-pay | Admitting: Family Medicine

## 2019-03-12 DIAGNOSIS — Z1231 Encounter for screening mammogram for malignant neoplasm of breast: Secondary | ICD-10-CM

## 2019-03-26 DIAGNOSIS — M810 Age-related osteoporosis without current pathological fracture: Secondary | ICD-10-CM | POA: Diagnosis not present

## 2019-03-26 DIAGNOSIS — F411 Generalized anxiety disorder: Secondary | ICD-10-CM | POA: Diagnosis not present

## 2019-03-26 DIAGNOSIS — Z0000 Encounter for general adult medical examination without abnormal findings: Secondary | ICD-10-CM | POA: Diagnosis not present

## 2019-03-26 DIAGNOSIS — R739 Hyperglycemia, unspecified: Secondary | ICD-10-CM | POA: Diagnosis not present

## 2019-03-26 DIAGNOSIS — E782 Mixed hyperlipidemia: Secondary | ICD-10-CM | POA: Diagnosis not present

## 2019-03-26 DIAGNOSIS — E559 Vitamin D deficiency, unspecified: Secondary | ICD-10-CM | POA: Diagnosis not present

## 2019-03-26 DIAGNOSIS — G4700 Insomnia, unspecified: Secondary | ICD-10-CM | POA: Diagnosis not present

## 2019-03-26 DIAGNOSIS — F3342 Major depressive disorder, recurrent, in full remission: Secondary | ICD-10-CM | POA: Diagnosis not present

## 2019-03-30 DIAGNOSIS — Z79899 Other long term (current) drug therapy: Secondary | ICD-10-CM | POA: Diagnosis not present

## 2019-03-30 DIAGNOSIS — Z5181 Encounter for therapeutic drug level monitoring: Secondary | ICD-10-CM | POA: Diagnosis not present

## 2019-03-30 DIAGNOSIS — G894 Chronic pain syndrome: Secondary | ICD-10-CM | POA: Diagnosis not present

## 2019-03-31 DIAGNOSIS — F3342 Major depressive disorder, recurrent, in full remission: Secondary | ICD-10-CM | POA: Diagnosis not present

## 2019-03-31 DIAGNOSIS — E559 Vitamin D deficiency, unspecified: Secondary | ICD-10-CM | POA: Diagnosis not present

## 2019-03-31 DIAGNOSIS — F411 Generalized anxiety disorder: Secondary | ICD-10-CM | POA: Diagnosis not present

## 2019-03-31 DIAGNOSIS — R739 Hyperglycemia, unspecified: Secondary | ICD-10-CM | POA: Diagnosis not present

## 2019-03-31 DIAGNOSIS — Z0000 Encounter for general adult medical examination without abnormal findings: Secondary | ICD-10-CM | POA: Diagnosis not present

## 2019-03-31 DIAGNOSIS — E782 Mixed hyperlipidemia: Secondary | ICD-10-CM | POA: Diagnosis not present

## 2019-03-31 DIAGNOSIS — M810 Age-related osteoporosis without current pathological fracture: Secondary | ICD-10-CM | POA: Diagnosis not present

## 2019-05-03 ENCOUNTER — Ambulatory Visit
Admission: RE | Admit: 2019-05-03 | Discharge: 2019-05-03 | Disposition: A | Discharge status: Home / Self Care | Payer: Medicare HMO | Source: Ambulatory Visit | Attending: Family Medicine | Admitting: Family Medicine

## 2019-05-03 ENCOUNTER — Other Ambulatory Visit: Payer: Self-pay

## 2019-05-03 DIAGNOSIS — Z1231 Encounter for screening mammogram for malignant neoplasm of breast: Secondary | ICD-10-CM

## 2019-05-04 ENCOUNTER — Other Ambulatory Visit: Payer: Self-pay | Admitting: Family Medicine

## 2019-05-04 DIAGNOSIS — R928 Other abnormal and inconclusive findings on diagnostic imaging of breast: Secondary | ICD-10-CM

## 2019-05-11 DIAGNOSIS — M170 Bilateral primary osteoarthritis of knee: Secondary | ICD-10-CM | POA: Diagnosis not present

## 2019-05-11 DIAGNOSIS — M1712 Unilateral primary osteoarthritis, left knee: Secondary | ICD-10-CM | POA: Diagnosis not present

## 2019-05-11 DIAGNOSIS — M1711 Unilateral primary osteoarthritis, right knee: Secondary | ICD-10-CM | POA: Diagnosis not present

## 2019-05-13 ENCOUNTER — Ambulatory Visit
Admission: RE | Admit: 2019-05-13 | Discharge: 2019-05-13 | Disposition: A | Discharge status: Home / Self Care | Payer: Medicare HMO | Source: Ambulatory Visit | Attending: Family Medicine | Admitting: Family Medicine

## 2019-05-13 ENCOUNTER — Other Ambulatory Visit: Payer: Self-pay

## 2019-05-13 DIAGNOSIS — R928 Other abnormal and inconclusive findings on diagnostic imaging of breast: Secondary | ICD-10-CM | POA: Diagnosis not present

## 2019-05-13 DIAGNOSIS — N6001 Solitary cyst of right breast: Secondary | ICD-10-CM | POA: Diagnosis not present

## 2019-05-27 DIAGNOSIS — H8110 Benign paroxysmal vertigo, unspecified ear: Secondary | ICD-10-CM | POA: Diagnosis not present

## 2019-05-27 DIAGNOSIS — I951 Orthostatic hypotension: Secondary | ICD-10-CM | POA: Diagnosis not present

## 2019-07-02 DIAGNOSIS — G894 Chronic pain syndrome: Secondary | ICD-10-CM | POA: Diagnosis not present

## 2019-07-25 DIAGNOSIS — M1612 Unilateral primary osteoarthritis, left hip: Secondary | ICD-10-CM | POA: Diagnosis not present

## 2019-07-25 DIAGNOSIS — M5416 Radiculopathy, lumbar region: Secondary | ICD-10-CM | POA: Diagnosis not present

## 2019-07-25 DIAGNOSIS — M1611 Unilateral primary osteoarthritis, right hip: Secondary | ICD-10-CM | POA: Diagnosis not present

## 2019-07-25 DIAGNOSIS — M160 Bilateral primary osteoarthritis of hip: Secondary | ICD-10-CM | POA: Diagnosis not present

## 2019-08-11 DIAGNOSIS — M160 Bilateral primary osteoarthritis of hip: Secondary | ICD-10-CM | POA: Diagnosis not present

## 2019-08-11 DIAGNOSIS — M1612 Unilateral primary osteoarthritis, left hip: Secondary | ICD-10-CM | POA: Diagnosis not present

## 2019-08-11 DIAGNOSIS — M1611 Unilateral primary osteoarthritis, right hip: Secondary | ICD-10-CM | POA: Diagnosis not present

## 2019-08-27 DIAGNOSIS — M19041 Primary osteoarthritis, right hand: Secondary | ICD-10-CM | POA: Diagnosis not present

## 2019-08-27 DIAGNOSIS — M79642 Pain in left hand: Secondary | ICD-10-CM | POA: Diagnosis not present

## 2019-08-27 DIAGNOSIS — M79641 Pain in right hand: Secondary | ICD-10-CM | POA: Diagnosis not present

## 2019-08-27 DIAGNOSIS — M19042 Primary osteoarthritis, left hand: Secondary | ICD-10-CM | POA: Diagnosis not present

## 2019-08-31 DIAGNOSIS — D485 Neoplasm of uncertain behavior of skin: Secondary | ICD-10-CM | POA: Diagnosis not present

## 2019-09-02 DIAGNOSIS — H6121 Impacted cerumen, right ear: Secondary | ICD-10-CM | POA: Diagnosis not present

## 2019-09-03 DIAGNOSIS — M160 Bilateral primary osteoarthritis of hip: Secondary | ICD-10-CM | POA: Diagnosis not present

## 2019-09-03 DIAGNOSIS — M1611 Unilateral primary osteoarthritis, right hip: Secondary | ICD-10-CM | POA: Diagnosis not present

## 2019-09-03 DIAGNOSIS — M7062 Trochanteric bursitis, left hip: Secondary | ICD-10-CM | POA: Diagnosis not present

## 2019-09-03 DIAGNOSIS — M1612 Unilateral primary osteoarthritis, left hip: Secondary | ICD-10-CM | POA: Diagnosis not present

## 2019-09-21 DIAGNOSIS — G4700 Insomnia, unspecified: Secondary | ICD-10-CM | POA: Diagnosis not present

## 2019-09-21 DIAGNOSIS — F411 Generalized anxiety disorder: Secondary | ICD-10-CM | POA: Diagnosis not present

## 2019-09-21 DIAGNOSIS — F3342 Major depressive disorder, recurrent, in full remission: Secondary | ICD-10-CM | POA: Diagnosis not present

## 2019-09-21 DIAGNOSIS — R3989 Other symptoms and signs involving the genitourinary system: Secondary | ICD-10-CM | POA: Diagnosis not present

## 2019-10-01 DIAGNOSIS — M7062 Trochanteric bursitis, left hip: Secondary | ICD-10-CM | POA: Diagnosis not present

## 2019-12-02 ENCOUNTER — Ambulatory Visit: Payer: Medicare HMO | Admitting: Adult Health

## 2019-12-02 ENCOUNTER — Encounter: Payer: Self-pay | Admitting: Adult Health

## 2019-12-02 ENCOUNTER — Other Ambulatory Visit: Payer: Self-pay

## 2019-12-02 VITALS — BP 140/80 | HR 84 | Ht 64.0 in | Wt 172.0 lb

## 2019-12-02 DIAGNOSIS — R5383 Other fatigue: Secondary | ICD-10-CM

## 2019-12-02 DIAGNOSIS — R0609 Other forms of dyspnea: Secondary | ICD-10-CM

## 2019-12-02 DIAGNOSIS — R0602 Shortness of breath: Secondary | ICD-10-CM

## 2019-12-02 DIAGNOSIS — I351 Nonrheumatic aortic (valve) insufficiency: Secondary | ICD-10-CM | POA: Diagnosis not present

## 2019-12-02 DIAGNOSIS — R0600 Dyspnea, unspecified: Secondary | ICD-10-CM | POA: Diagnosis not present

## 2019-12-02 DIAGNOSIS — G894 Chronic pain syndrome: Secondary | ICD-10-CM | POA: Diagnosis not present

## 2019-12-02 MED ORDER — METOPROLOL TARTRATE 50 MG PO TABS: 50.0000 mg | ORAL_TABLET | Freq: Once | ORAL | 0 refills | Status: DC
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
## 2019-12-02 NOTE — Patient Instructions (Addendum)
Medication Instructions:  TAKE- Metoprolol 50 mg 1 tablet by mouth 2 hours prior to procedure  *If you need a refill on your cardiac medications before your next appointment, please call your pharmacy*   Lab Work: [facility]  If you have labs (blood work) drawn today and your tests are completely normal, you will receive your results only by: Marland Kitchen MyChart Message (if you have MyChart) OR . A paper copy in the mail If you have any lab test that is abnormal or we need to change your treatment, we will call you to review the results.   Testing/Procedures: Cardiac CT Angiography (CTA), is a special type of CT scan that uses a computer to produce multi-dimensional views of major blood vessels throughout the body. In CT angiography, a contrast material is injected through an IV to help visualize the blood vessels   Follow-Up: At [facility], you and your health needs are our priority.  As part of our continuing mission to provide you with exceptional heart care, we have created designated Provider Care Teams.  These Care Teams include your primary Cardiologist (physician) and Advanced Practice Providers (APPs -  Physician Assistants and Nurse Practitioners) who all work together to provide you with the care you need, when you need it.  We recommend signing up for the patient portal called "MyChart".  Sign up information is provided on this After Visit Summary.  MyChart is used to connect with patients for Virtual Visits (Telemedicine).  Patients are able to view lab/test results, encounter notes, upcoming appointments, etc.  Non-urgent messages can be sent to your provider as well.   To learn more about what you can do with MyChart, go to [email or web address].    Your next appointment:   6 week(s)  The format for your next appointment:   In Person  Provider:   [name], MD   Other Instructions You have been referred to Pulmonologist   Your cardiac CT will be scheduled at one of the  below locations:   [facility] [address] [place], [place] [address] [phone]  [facility] [address] [place], [place] [address] [phone]  If scheduled at [facility], please arrive at the [facility] main entrance of [facility] 30 minutes prior to test start time. Proceed to the [facility] Radiology Department (first floor) to check-in and test prep.  If scheduled at [facility], please arrive 15 mins early for check-in and test prep.  Please follow these instructions carefully (unless otherwise directed):  On the Night Before the Test: . Be sure to Drink plenty of water. . Do not consume any caffeinated/decaffeinated beverages or chocolate 12 hours prior to your test. . Do not take any antihistamines 12 hours prior to your test.   On the Day of the Test: . Drink plenty of water. Do not drink any water within one hour of the test. . Do not eat any food 4 hours prior to the test. . You may take your regular medications prior to the test.  . Take metoprolol (Lopressor) two hours prior to test. . FEMALES- please wear underwire-free bra if available       After the Test: . Drink plenty of water. . After receiving IV contrast, you may experience a mild flushed feeling. This is normal. . On occasion, you may experience a mild rash up to 24 hours after the test. This is not dangerous. If  this occurs, you can take Benadryl 25 mg and increase your fluid intake. . If you experience trouble breathing, this can be serious. If it is severe call 911 IMMEDIATELY. If it is mild, please call our office. . If you take any of these medications: Glipizide/Metformin, Avandament, Glucavance, please do not take 48 hours after completing test unless otherwise instructed.   Once we have confirmed authorization from your insurance company, we will call you to set up a date and  time for your test. Based on how quickly your insurance processes prior authorizations requests, please allow up to 4 weeks to be contacted for scheduling your Cardiac CT appointment. Be advised that routine Cardiac CT appointments could be scheduled as many as 8 weeks after your provider has ordered it.  For non-scheduling related questions, please contact the cardiac imaging nurse navigator should you have any questions/concerns: Marchia Bond, Cardiac Imaging Nurse Navigator Burley Saver, Interim Cardiac Imaging Nurse [facility] Direct Office Dial: [phone]   For scheduling needs, including cancellations and rescheduling, please call Vivien Rota at [phone].

## 2019-12-09 DIAGNOSIS — R0600 Dyspnea, unspecified: Secondary | ICD-10-CM | POA: Diagnosis not present

## 2019-12-09 DIAGNOSIS — R5383 Other fatigue: Secondary | ICD-10-CM | POA: Diagnosis not present

## 2019-12-09 DIAGNOSIS — R0602 Shortness of breath: Secondary | ICD-10-CM | POA: Diagnosis not present

## 2019-12-09 LAB — BASIC METABOLIC PANEL
BUN/Creatinine Ratio: 27 (ref 12–28)
BUN: 18 mg/dL (ref 8–27)
CO2: 24 mmol/L (ref 20–29)
Calcium: 9.8 mg/dL (ref 8.7–10.3)
Chloride: 102 mmol/L (ref 96–106)
Creatinine, Ser: 0.66 mg/dL (ref 0.57–1.00)
GFR calc Af Amer: 101 mL/min/{1.73_m2} (ref >59)
GFR calc non Af Amer: 87 mL/min/{1.73_m2} (ref >59)
Glucose: 97 mg/dL (ref 65–99)
Potassium: 4.5 mmol/L (ref 3.5–5.2)
Sodium: 137 mmol/L (ref 134–144)

## 2019-12-14 ENCOUNTER — Telehealth (HOSPITAL_COMMUNITY): Payer: Self-pay | Admitting: *Deleted

## 2019-12-14 NOTE — Telephone Encounter (Signed)

## 2019-12-15 ENCOUNTER — Other Ambulatory Visit: Payer: Self-pay

## 2019-12-15 ENCOUNTER — Ambulatory Visit (HOSPITAL_COMMUNITY)
Admission: RE | Admit: 2019-12-15 | Discharge: 2019-12-15 | Disposition: A | Discharge status: Home / Self Care | Payer: Medicare HMO | Source: Ambulatory Visit | Attending: Adult Health | Admitting: Adult Health

## 2019-12-15 DIAGNOSIS — R5383 Other fatigue: Secondary | ICD-10-CM | POA: Insufficient documentation

## 2019-12-15 DIAGNOSIS — R0600 Dyspnea, unspecified: Secondary | ICD-10-CM | POA: Insufficient documentation

## 2019-12-15 DIAGNOSIS — I700 Atherosclerosis of aorta: Secondary | ICD-10-CM | POA: Insufficient documentation

## 2019-12-15 DIAGNOSIS — R0602 Shortness of breath: Secondary | ICD-10-CM | POA: Insufficient documentation

## 2019-12-15 DIAGNOSIS — R0609 Other forms of dyspnea: Secondary | ICD-10-CM

## 2019-12-15 MED ORDER — IOHEXOL 350 MG/ML SOLN
80.0000 mL | Freq: Once | INTRAVENOUS | Status: AC | PRN
  Administered 2019-12-15: 80 mL via INTRAVENOUS

## 2019-12-15 MED ORDER — NITROGLYCERIN 0.4 MG SL SUBL
0.8000 mg | SUBLINGUAL_TABLET | Freq: Once | SUBLINGUAL | Status: AC
  Administered 2019-12-15: 0.8 mg via SUBLINGUAL

## 2019-12-15 MED ORDER — NITROGLYCERIN 0.4 MG SL SUBL
SUBLINGUAL_TABLET | SUBLINGUAL | Status: AC
  Filled 2019-12-15: qty 2

## 2019-12-21 DIAGNOSIS — M1711 Unilateral primary osteoarthritis, right knee: Secondary | ICD-10-CM | POA: Diagnosis not present

## 2019-12-21 DIAGNOSIS — M1712 Unilateral primary osteoarthritis, left knee: Secondary | ICD-10-CM | POA: Diagnosis not present

## 2019-12-21 DIAGNOSIS — M170 Bilateral primary osteoarthritis of knee: Secondary | ICD-10-CM | POA: Diagnosis not present

## 2019-12-23 ENCOUNTER — Telehealth: Payer: Self-pay | Admitting: *Deleted

## 2019-12-23 DIAGNOSIS — R0602 Shortness of breath: Secondary | ICD-10-CM

## 2019-12-23 DIAGNOSIS — R5383 Other fatigue: Secondary | ICD-10-CM

## 2019-12-23 MED ORDER — METOPROLOL TARTRATE 50 MG PO TABS: 50.0000 mg | ORAL_TABLET | Freq: Once | ORAL | 0 refills | Status: DC

## 2019-12-23 NOTE — Telephone Encounter (Signed)
Leave message for pt to call back 

## 2019-12-23 NOTE — Telephone Encounter (Signed)
-----  Message from [name], NP sent at 12/23/2019  8:14 AM EDT ----- Regarding: FW: Please reorder this scan. See message below   Thanks, Curt Bears ----- Message ----- From: Zeb Comfort, RN Sent: 12/23/2019   7:45 AM EDT To: [name], NP, Silverio Lay, RN  Hi.  Dr. Radford Pax is reading this scan but the artifact on the scan is unreadable. We have tried to recon to fix the issue but it hasn't work.   We will need to repeat the scan.  This will not incur another charge.  Can you put another order in so we can reschedule?  Thanks Kerin Ransom

## 2019-12-23 NOTE — Telephone Encounter (Signed)
Spoke with pt letting her know her CTA will need to be redone, explain to pt the procedure protocol, advised pt metoprolol 50 mg will be send into pt pharmacy and she is to take it 4hours before procedure, pt voice understanding and thanks.    Your cardiac CT will be scheduled at one of the below locations:   [facility] [address] [place], [place] [address] [phone]  [place] [address] [place], [place] [address] [phone]  If scheduled at [facility], please arrive at the [facility] main entrance of [facility] 30 minutes prior to test start time. Proceed to the [facility] Radiology Department (first floor) to check-in and test prep.  If scheduled at [facility], please arrive 15 mins early for check-in and test prep.  Please follow these instructions carefully (unless otherwise directed):   On the Night Before the Test: . Be sure to Drink plenty of water. . Do not consume any caffeinated/decaffeinated beverages or chocolate 12 hours prior to your test. . Do not take any antihistamines 12 hours prior to your test.   On the Day of the Test: . Drink plenty of water. Do not drink any water within one hour of the test. . Do not eat any food 4 hours prior to the test. . You may take your regular medications prior to the test.  . Take metoprolol (Lopressor) two hours prior to test. . HOLD Furosemide/Hydrochlorothiazide morning of the test. . FEMALES- please wear underwire-free bra if available  After the Test: . Drink plenty of water. . After receiving IV contrast, you may experience a mild flushed feeling. This is normal. . On occasion, you may experience a mild rash up to 24 hours after the test. This is not dangerous. If this occurs, you can take Benadryl 25 mg and increase your fluid intake. . If you experience trouble breathing, this can be serious.  If it is severe call 911 IMMEDIATELY. If it is mild, please call our office. . If you take any of these medications: Glipizide/Metformin, Avandament, Glucavance, please do not take 48 hours after completing test unless otherwise instructed.   Once we have confirmed authorization from your insurance company, we will call you to set up a date and time for your test. Based on how quickly your insurance processes prior authorizations requests, please allow up to 4 weeks to be contacted for scheduling your Cardiac CT appointment. Be advised that routine Cardiac CT appointments could be scheduled as many as 8 weeks after your provider has ordered it.  For non-scheduling related questions, please contact the cardiac imaging nurse navigator should you have any questions/concerns: Marchia Bond, Cardiac Imaging Nurse Navigator Burley Saver, Interim Cardiac Imaging Nurse [place] [facility] Direct Office Dial: [phone]   For scheduling needs, including cancellations and rescheduling, please call Vivien Rota at [phone], option 3.

## 2019-12-29 ENCOUNTER — Telehealth (HOSPITAL_COMMUNITY): Payer: Self-pay | Admitting: *Deleted

## 2019-12-29 NOTE — Telephone Encounter (Signed)
Reaching out to patient to offer assistance regarding upcoming cardiac imaging study; pt verbalizes understanding of appt date/time, parking situation and where to check in, pre-test NPO status and medications ordered, and verified current allergies; name and call back number provided for further questions should they arise ° °Antoninette Lerner Tai RN Navigator Cardiac Imaging °[facility] °[phone] office °[phone] cell ° °

## 2019-12-30 ENCOUNTER — Ambulatory Visit (HOSPITAL_COMMUNITY)
Admission: RE | Admit: 2019-12-30 | Discharge: 2019-12-30 | Disposition: A | Discharge status: Home / Self Care | Payer: Medicare HMO | Source: Ambulatory Visit | Attending: Adult Health | Admitting: Adult Health

## 2019-12-30 ENCOUNTER — Other Ambulatory Visit: Payer: Self-pay

## 2019-12-30 DIAGNOSIS — R0602 Shortness of breath: Secondary | ICD-10-CM | POA: Insufficient documentation

## 2019-12-30 DIAGNOSIS — R5383 Other fatigue: Secondary | ICD-10-CM | POA: Diagnosis not present

## 2019-12-30 DIAGNOSIS — I700 Atherosclerosis of aorta: Secondary | ICD-10-CM | POA: Insufficient documentation

## 2019-12-30 DIAGNOSIS — I351 Nonrheumatic aortic (valve) insufficiency: Secondary | ICD-10-CM | POA: Diagnosis not present

## 2019-12-30 DIAGNOSIS — H0015 Chalazion left lower eyelid: Secondary | ICD-10-CM | POA: Diagnosis not present

## 2019-12-30 DIAGNOSIS — R0600 Dyspnea, unspecified: Secondary | ICD-10-CM | POA: Diagnosis not present

## 2019-12-30 MED ORDER — NITROGLYCERIN 0.4 MG SL SUBL
SUBLINGUAL_TABLET | SUBLINGUAL | Status: AC
  Administered 2019-12-30: 0.8 mg via SUBLINGUAL
  Filled 2019-12-30: qty 2

## 2019-12-30 MED ORDER — IOHEXOL 350 MG/ML SOLN
80.0000 mL | Freq: Once | INTRAVENOUS | Status: AC | PRN
  Administered 2019-12-30: 80 mL via INTRAVENOUS

## 2019-12-30 MED ORDER — NITROGLYCERIN 0.4 MG SL SUBL: 0.8000 mg | SUBLINGUAL_TABLET | Freq: Once | SUBLINGUAL | Status: AC

## 2020-01-14 ENCOUNTER — Encounter: Payer: Self-pay | Admitting: General Practice

## 2020-01-14 ENCOUNTER — Other Ambulatory Visit: Payer: Self-pay

## 2020-01-14 ENCOUNTER — Ambulatory Visit: Payer: Medicare HMO | Admitting: General Practice

## 2020-01-14 ENCOUNTER — Ambulatory Visit: Payer: Medicare HMO | Admitting: Adult Health

## 2020-01-14 VITALS — BP 122/64 | HR 99 | Ht 64.0 in | Wt 178.0 lb

## 2020-01-14 DIAGNOSIS — R0602 Shortness of breath: Secondary | ICD-10-CM | POA: Diagnosis not present

## 2020-01-14 DIAGNOSIS — Z79899 Other long term (current) drug therapy: Secondary | ICD-10-CM | POA: Diagnosis not present

## 2020-01-14 DIAGNOSIS — R5383 Other fatigue: Secondary | ICD-10-CM | POA: Diagnosis not present

## 2020-01-14 DIAGNOSIS — I2510 Atherosclerotic heart disease of native coronary artery without angina pectoris: Secondary | ICD-10-CM

## 2020-01-14 DIAGNOSIS — R011 Cardiac murmur, unspecified: Secondary | ICD-10-CM

## 2020-01-14 NOTE — Patient Instructions (Signed)
Medication Instructions:  Continue current medications  *If you need a refill on your cardiac medications before your next appointment, please call your pharmacy*   Lab Work: Fasting Lipid and CBC  If you have labs (blood work) drawn today and your tests are completely normal, you will receive your results only by:  [place] (if you have MyChart) OR  A paper copy in the mail If you have any lab test that is abnormal or we need to change your treatment, we will call you to review the results.   Testing/Procedures: Your physician has requested that you have an echocardiogram. Echocardiography is a painless test that uses sound waves to create images of your heart. It provides your doctor with information about the size and shape of your heart and how well your hearts chambers and valves are working. This procedure takes approximately one hour. There are no restrictions for this procedure.   Follow-Up: At [facility], you and your health needs are our priority.  As part of our continuing mission to provide you with exceptional heart care, we have created designated Provider Care Teams.  These Care Teams include your primary Cardiologist (physician) and Advanced Practice Providers (APPs -  Physician Assistants and Nurse Practitioners) who all work together to provide you with the care you need, when you need it.  We recommend signing up for the patient portal called "MyChart".  Sign up information is provided on this After Visit Summary.  MyChart is used to connect with patients for Virtual Visits (Telemedicine).  Patients are able to view lab/test results, encounter notes, upcoming appointments, etc.  Non-urgent messages can be sent to your provider as well.   To learn more about what you can do with MyChart, go to [email or web address].    Your next appointment:   1 month(s)  The format for your next appointment:   In Person  Provider:   Coletta Memos, FNP
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
## 2020-01-17 DIAGNOSIS — Z79899 Other long term (current) drug therapy: Secondary | ICD-10-CM | POA: Diagnosis not present

## 2020-01-17 DIAGNOSIS — I2510 Atherosclerotic heart disease of native coronary artery without angina pectoris: Secondary | ICD-10-CM | POA: Diagnosis not present

## 2020-01-17 LAB — CBC
Hematocrit: 34.6 % (ref 34.0–46.6)
Hemoglobin: 11.5 g/dL (ref 11.1–15.9)
MCH: 29 pg (ref 26.6–33.0)
MCHC: 33.2 g/dL (ref 31.5–35.7)
MCV: 87 fL (ref 79–97)
Platelets: 219 10*3/uL (ref 150–450)
RBC: 3.97 x10E6/uL (ref 3.77–5.28)
RDW: 13.1 % (ref 11.7–15.4)
WBC: 5.1 10*3/uL (ref 3.4–10.8)

## 2020-01-17 LAB — LIPID PANEL
Chol/HDL Ratio: 4.6 ratio — ABNORMAL HIGH (ref 0.0–4.4)
Cholesterol, Total: 155 mg/dL (ref 100–199)
HDL: 34 mg/dL — ABNORMAL LOW (ref >39)
LDL Chol Calc (NIH): 84 mg/dL (ref 0–99)
Triglycerides: 220 mg/dL — ABNORMAL HIGH (ref 0–149)
VLDL Cholesterol Cal: 37 mg/dL (ref 5–40)

## 2020-01-19 ENCOUNTER — Other Ambulatory Visit: Payer: Self-pay

## 2020-01-19 ENCOUNTER — Telehealth: Payer: Self-pay

## 2020-01-19 ENCOUNTER — Telehealth: Payer: Self-pay | Admitting: Cardiovascular Disease

## 2020-01-19 ENCOUNTER — Ambulatory Visit (HOSPITAL_COMMUNITY): Payer: Medicare HMO | Attending: Cardiovascular Disease

## 2020-01-19 DIAGNOSIS — I2510 Atherosclerotic heart disease of native coronary artery without angina pectoris: Secondary | ICD-10-CM

## 2020-01-19 DIAGNOSIS — R0602 Shortness of breath: Secondary | ICD-10-CM

## 2020-01-19 DIAGNOSIS — R5383 Other fatigue: Secondary | ICD-10-CM | POA: Diagnosis not present

## 2020-01-19 DIAGNOSIS — Z79899 Other long term (current) drug therapy: Secondary | ICD-10-CM

## 2020-01-19 LAB — ECHOCARDIOGRAM COMPLETE
Area-P 1/2: 5.16 cm2
S' Lateral: 1.8 cm

## 2020-01-19 MED ORDER — ICOSAPENT ETHYL 1 G PO CAPS: 2.0000 g | ORAL_CAPSULE | Freq: Two times a day (BID) | ORAL | 3 refills | Status: DC

## 2020-01-19 MED ORDER — FENOFIBRATE 50 MG PO CAPS: 50.0000 mg | ORAL_CAPSULE | Freq: Every day | ORAL | 3 refills | Status: DC

## 2020-01-19 MED ORDER — PERFLUTREN LIPID MICROSPHERE
1.0000 mL | INTRAVENOUS | Status: AC | PRN
  Administered 2020-01-19: 1 mL via INTRAVENOUS

## 2020-01-19 NOTE — Telephone Encounter (Signed)
Thanks Judson Roch, I will let Tammie know.

## 2020-01-19 NOTE — Telephone Encounter (Signed)
Lets start her on fenofibrate 50 mg daily. She needs to take this with food.  She can continue to take her fish oil and we will check her lipid panel in 8 weeks.   I had originally thought about starting her on Vascepa but fenofibrate will be a much cheaper option. Thank you.

## 2020-01-19 NOTE — Telephone Encounter (Signed)
Secure chat received from Coletta Memos NP-   Lets start her on fenofibrate 50 mg daily. She needs to take this with food She can continue to take her fish oil and we will check her lipid panel in 8 weeks. I had originally thought about starting her on Vascepa but fenofibrate will be a much cheaper option. Thank you   called and spoke with pt, reviewed Jesse's updated recommendations. Pt verbalized understanding with no other questions. This RN apologized for any confusion. Pt very kind and had no issues. Will cancel order for vascepa and send in prescription for fenofibrate 50mg  daily. Will mail lab slip.

## 2020-01-19 NOTE — Telephone Encounter (Signed)
Called and spoke with pt, notified I did not see where someone had tried to call her but notified it may be  That someone was trying to call her with her lab results. Reviewed her lab results from Johnanna Schneiders NP. Notified that he would like her to stop her fishoil and  start Vascepa 2g twice a day. Pt verbalized understanding.  She would like these results sent to her PCP as well.  Pt also stated that she had just had her echo done and Tammy who was helping her was really good and she wanted her supervisor to know.   Pt had no other questions at this time.

## 2020-01-19 NOTE — Telephone Encounter (Signed)
Patient states she is returning a call. However no notes are available. Patient completed echocardiogram about 15 minutes ago. However, results have not been released. Made patient aware that I am forwarding callback message to the triage.

## 2020-01-20 MED ORDER — FENOFIBRATE 54 MG PO TABS: 54.0000 mg | ORAL_TABLET | Freq: Every day | ORAL | 6 refills | Status: DC

## 2020-01-20 MED ORDER — ICOSAPENT ETHYL 1 G PO CAPS: 2.0000 g | ORAL_CAPSULE | Freq: Two times a day (BID) | ORAL | 6 refills | Status: DC

## 2020-01-20 NOTE — Addendum Note (Signed)
Addended by: Waylan Rocher on: 01/20/2020 12:13 PM   Modules accepted: Orders

## 2020-01-28 ENCOUNTER — Other Ambulatory Visit: Payer: Self-pay

## 2020-01-28 DIAGNOSIS — Z79899 Other long term (current) drug therapy: Secondary | ICD-10-CM

## 2020-01-28 DIAGNOSIS — I2510 Atherosclerotic heart disease of native coronary artery without angina pectoris: Secondary | ICD-10-CM

## 2020-01-28 MED ORDER — ICOSAPENT ETHYL 1 G PO CAPS: 2.0000 g | ORAL_CAPSULE | Freq: Two times a day (BID) | ORAL | 4 refills | Status: DC

## 2020-01-31 ENCOUNTER — Ambulatory Visit: Payer: Medicare HMO | Admitting: Internal Medicine

## 2020-01-31 ENCOUNTER — Other Ambulatory Visit: Payer: Self-pay

## 2020-01-31 ENCOUNTER — Encounter: Payer: Self-pay | Admitting: Internal Medicine

## 2020-01-31 VITALS — BP 126/60 | HR 96 | Temp 97.7°F | Ht 64.0 in | Wt 176.6 lb

## 2020-01-31 DIAGNOSIS — R0602 Shortness of breath: Secondary | ICD-10-CM

## 2020-01-31 DIAGNOSIS — Z87898 Personal history of other specified conditions: Secondary | ICD-10-CM

## 2020-01-31 DIAGNOSIS — R0600 Dyspnea, unspecified: Secondary | ICD-10-CM

## 2020-01-31 DIAGNOSIS — R0609 Other forms of dyspnea: Secondary | ICD-10-CM

## 2020-01-31 DIAGNOSIS — D649 Anemia, unspecified: Secondary | ICD-10-CM | POA: Diagnosis not present

## 2020-01-31 NOTE — Progress Notes (Signed)
Patient refused to have an overnight study done/ordered even after being educated on what it was and it's purpose. Writer made Dr Chase Caller aware.
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
## 2020-01-31 NOTE — Patient Instructions (Addendum)
ICD-10-CM   1. Dyspnea on exertion  R06.00   2. History of snoring  Z87.898   3. Anemia, unspecified type  D64.9     Currently shortness of breath remains unexplained.  Possibilities include pulmonary issue, stiff heart muscle, physical deconditioning, and anemia  Plan -Do full pulmonary function test and exhaled nitric oxide testing -first available -Do high-resolution CT chest supine and prone -Do overnight oxygen study -based on these results you might have to refer you to a sleep doctor  Followup  -Return to see Dr. Chase Caller or nurse practitioner first available after completing these tests -If these tests are negative we will have to do a bike pulmonary stress test or pursue anemia workup to get to the bottom of your shortness of breath

## 2020-02-01 DIAGNOSIS — M545 Low back pain: Secondary | ICD-10-CM | POA: Diagnosis not present

## 2020-02-01 DIAGNOSIS — M1612 Unilateral primary osteoarthritis, left hip: Secondary | ICD-10-CM | POA: Diagnosis not present

## 2020-02-04 ENCOUNTER — Other Ambulatory Visit: Payer: Self-pay

## 2020-02-04 ENCOUNTER — Ambulatory Visit (INDEPENDENT_AMBULATORY_CARE_PROVIDER_SITE_OTHER)
Admission: RE | Admit: 2020-02-04 | Discharge: 2020-02-04 | Disposition: A | Discharge status: Home / Self Care | Payer: Medicare HMO | Source: Ambulatory Visit | Attending: Internal Medicine | Admitting: Internal Medicine

## 2020-02-04 DIAGNOSIS — R0602 Shortness of breath: Secondary | ICD-10-CM

## 2020-02-04 NOTE — Progress Notes (Signed)
Will discuss at followup. Good news - no cancer, no fibrosis, no pneumonia. No copd   CT Chest High Resolution  Result Date: 02/04/2020 CLINICAL DATA:  Shortness of breath EXAM: CT CHEST WITHOUT CONTRAST TECHNIQUE: Multidetector CT imaging of the chest was performed following the standard protocol without intravenous contrast. High resolution imaging of the lungs, as well as inspiratory and expiratory imaging, was performed. COMPARISON:  CT coronary angiogram, 12/30/2019 FINDINGS: Cardiovascular: Aortic atherosclerosis. Normal heart size. Right coronary artery calcifications, better assessed by recent gated contrast enhanced CT coronary angiogram. No pericardial effusion. Mediastinum/Nodes: No enlarged mediastinal, hilar, or axillary lymph nodes. Thyroid gland, trachea, and esophagus demonstrate no significant findings. Lungs/Pleura: Mild lobular air trapping on expiratory phase imaging. No pleural effusion or pneumothorax. Upper Abdomen: No acute abnormality. Musculoskeletal: No chest wall mass or suspicious bone lesions identified. IMPRESSION: 1. No evidence of fibrotic interstitial lung disease. 2. Mild lobular air trapping on expiratory phase imaging, suggestive of small airways disease. 3. Coronary artery disease, better assessed by recent contrast enhanced CT coronary angiogram. 4. Aortic Atherosclerosis (ICD10-I70.0). Electronically Signed   By: Eddie Candle M.D.   On: 02/04/2020 14:55      CT Chest High Resolution  Result Date: 02/04/2020 CLINICAL DATA:  Shortness of breath EXAM: CT CHEST WITHOUT CONTRAST TECHNIQUE: Multidetector CT imaging of the chest was performed following the standard protocol without intravenous contrast. High resolution imaging of the lungs, as well as inspiratory and expiratory imaging, was performed. COMPARISON:  CT coronary angiogram, 12/30/2019 FINDINGS: Cardiovascular: Aortic atherosclerosis. Normal heart size. Right coronary artery calcifications, better assessed by  recent gated contrast enhanced CT coronary angiogram. No pericardial effusion. Mediastinum/Nodes: No enlarged mediastinal, hilar, or axillary lymph nodes. Thyroid gland, trachea, and esophagus demonstrate no significant findings. Lungs/Pleura: Mild lobular air trapping on expiratory phase imaging. No pleural effusion or pneumothorax. Upper Abdomen: No acute abnormality. Musculoskeletal: No chest wall mass or suspicious bone lesions identified. IMPRESSION: 1. No evidence of fibrotic interstitial lung disease. 2. Mild lobular air trapping on expiratory phase imaging, suggestive of small airways disease. 3. Coronary artery disease, better assessed by recent contrast enhanced CT coronary angiogram. 4. Aortic Atherosclerosis (ICD10-I70.0). Electronically Signed   By: Eddie Candle M.D.   On: 02/04/2020 14:55

## 2020-02-10 DIAGNOSIS — M5416 Radiculopathy, lumbar region: Secondary | ICD-10-CM | POA: Diagnosis not present
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
## 2020-02-14 ENCOUNTER — Ambulatory Visit: Payer: Medicare HMO | Admitting: General Practice

## 2020-02-14 ENCOUNTER — Other Ambulatory Visit: Payer: Self-pay

## 2020-02-14 ENCOUNTER — Encounter: Payer: Self-pay | Admitting: General Practice

## 2020-02-14 VITALS — BP 128/66 | HR 79 | Ht 61.0 in | Wt 174.0 lb

## 2020-02-14 DIAGNOSIS — R5383 Other fatigue: Secondary | ICD-10-CM

## 2020-02-14 DIAGNOSIS — I2510 Atherosclerotic heart disease of native coronary artery without angina pectoris: Secondary | ICD-10-CM | POA: Diagnosis not present

## 2020-02-14 DIAGNOSIS — I351 Nonrheumatic aortic (valve) insufficiency: Secondary | ICD-10-CM

## 2020-02-14 DIAGNOSIS — E782 Mixed hyperlipidemia: Secondary | ICD-10-CM | POA: Diagnosis not present

## 2020-02-14 DIAGNOSIS — R0609 Other forms of dyspnea: Secondary | ICD-10-CM

## 2020-02-14 DIAGNOSIS — Z79899 Other long term (current) drug therapy: Secondary | ICD-10-CM | POA: Diagnosis not present

## 2020-02-14 DIAGNOSIS — R0600 Dyspnea, unspecified: Secondary | ICD-10-CM | POA: Diagnosis not present

## 2020-02-14 NOTE — Addendum Note (Signed)
Addended by: Waylan Rocher on: 02/14/2020 08:28 AM   Modules accepted: Orders

## 2020-02-14 NOTE — Patient Instructions (Signed)
Medication Instructions:  The current medical regimen is effective;  continue present plan and medications as directed. Please refer to the Current Medication list given to you today. *If you need a refill on your cardiac medications before your next appointment, please call your pharmacy*  Lab [facility] FASTING LIPID AND LFT IN 4 WEEKS (03-15-2020) If you have labs (blood work) drawn today and your tests are completely normal, you will receive your results only by:  [place] (if you have MyChart) OR A paper copy in the mail.  If you have any lab test that is abnormal or we need to change your treatment, we will call you to review the results. You may go to any [facility] that is convenient for you however, we do have a lab in our office that is able to assist you. You DO NOT need an appointment for our lab. The lab is open 8:00am and closes at 4:00pm. Lunch 12:45 - 1:45pm.  Special Instructions PLEASE READ AND FOLLOW SALTY 6-ATTACHED  Follow-Up: Your next appointment:  6 month(s)   In Person with Kirk Ruths, MD -[place], FNP-C  At [facility], you and your health needs are our priority.  As part of our continuing mission to provide you with exceptional heart care, we have created designated Provider Care Teams.  These Care Teams include your primary Cardiologist (physician) and Advanced Practice Providers (APPs -  Physician Assistants and Nurse Practitioners) who all work together to provide you with the care you need, when you need it.

## 2020-02-16 DIAGNOSIS — M5416 Radiculopathy, lumbar region: Secondary | ICD-10-CM | POA: Diagnosis not present

## 2020-02-22 DIAGNOSIS — M1612 Unilateral primary osteoarthritis, left hip: Secondary | ICD-10-CM | POA: Diagnosis not present

## 2020-02-22 DIAGNOSIS — M170 Bilateral primary osteoarthritis of knee: Secondary | ICD-10-CM | POA: Diagnosis not present

## 2020-02-23 DIAGNOSIS — M5416 Radiculopathy, lumbar region: Secondary | ICD-10-CM | POA: Diagnosis not present

## 2020-02-28 DIAGNOSIS — Z79899 Other long term (current) drug therapy: Secondary | ICD-10-CM | POA: Diagnosis not present

## 2020-03-01 DIAGNOSIS — M170 Bilateral primary osteoarthritis of knee: Secondary | ICD-10-CM | POA: Diagnosis not present

## 2020-03-02 DIAGNOSIS — M545 Low back pain: Secondary | ICD-10-CM | POA: Diagnosis not present

## 2020-03-06 ENCOUNTER — Ambulatory Visit (INDEPENDENT_AMBULATORY_CARE_PROVIDER_SITE_OTHER): Payer: Medicare HMO | Admitting: Adult Health

## 2020-03-06 ENCOUNTER — Other Ambulatory Visit: Payer: Self-pay

## 2020-03-06 ENCOUNTER — Ambulatory Visit (INDEPENDENT_AMBULATORY_CARE_PROVIDER_SITE_OTHER): Payer: Medicare HMO | Admitting: Internal Medicine

## 2020-03-06 ENCOUNTER — Encounter: Payer: Self-pay | Admitting: Adult Health

## 2020-03-06 VITALS — BP 116/62 | HR 101 | Temp 97.7°F | Ht 64.0 in | Wt 177.0 lb

## 2020-03-06 DIAGNOSIS — R0600 Dyspnea, unspecified: Secondary | ICD-10-CM

## 2020-03-06 DIAGNOSIS — R0609 Other forms of dyspnea: Secondary | ICD-10-CM

## 2020-03-06 DIAGNOSIS — M2550 Pain in unspecified joint: Secondary | ICD-10-CM

## 2020-03-06 LAB — PULMONARY FUNCTION TEST
DL/VA % pred: 127 %
DL/VA: 5.22 ml/min/mmHg/L
DLCO cor % pred: 101 %
DLCO cor: 19.46 ml/min/mmHg
DLCO unc % pred: 101 %
DLCO unc: 19.46 ml/min/mmHg
FEF 25-75 Post: 3.74 L/sec
FEF 25-75 Pre: 2.65 L/sec
FEF2575-%Change-Post: 41 %
FEF2575-%Pred-Post: 225 %
FEF2575-%Pred-Pre: 159 %
FEV1-%Change-Post: 11 %
FEV1-%Pred-Post: 106 %
FEV1-%Pred-Pre: 95 %
FEV1-Post: 2.24 L
FEV1-Pre: 2.01 L
FEV1FVC-%Change-Post: 4 %
FEV1FVC-%Pred-Pre: 113 %
FEV6-%Change-Post: 6 %
FEV6-%Pred-Post: 94 %
FEV6-%Pred-Pre: 88 %
FEV6-Post: 2.51 L
FEV6-Pre: 2.37 L
FEV6FVC-%Pred-Post: 105 %
FEV6FVC-%Pred-Pre: 105 %
FVC-%Change-Post: 6 %
FVC-%Pred-Post: 89 %
FVC-%Pred-Pre: 84 %
FVC-Post: 2.51 L
FVC-Pre: 2.37 L
Post FEV1/FVC ratio: 89 %
Post FEV6/FVC ratio: 100 %
Pre FEV1/FVC ratio: 85 %
Pre FEV6/FVC Ratio: 100 %
RV % pred: 5 %
RV: 0.13 L
TLC % pred: 61 %
TLC: 3.1 L

## 2020-03-06 LAB — NITRIC OXIDE: Nitric Oxide: 12

## 2020-03-06 NOTE — Assessment & Plan Note (Signed)
Dyspnea with exertion questionable etiology may be a component of deconditioning Pulmonary work-up has been unrevealing high-resolution CT chest showed no evidence of interstitial lung disease. Pulmonary function testing was normal with no airflow obstruction or restriction.  Exhaled nitric oxide testing was low.  Does not appear that this is an asthmatic component as patient has no wheezing or cough. This did seem to coincide with some joint issues we will check a rheumatoid arthritis and ANA just to make sure no underlying autoimmune/connective tissue disorder.  There was no evidence on CT chest for interstitial process. Recent labs showed no evidence of anemia. She does have a follow-up with her primary care provider later this month have asked her to discuss these issues.  She has been going to physical therapy which she feels has helped some. If symptoms persist could consider cardiopulmonary stress test to look at if this is more of deconditioning component.  Has seen cardiology with unrevealing cardiac work-up except for diastolic dysfunction.  No evidence of volume overload on exam.  Plan  Patient Instructions  Activity as tolerated, advance as able.  Follow up with Pulmonary as needed.  Labs today  Follow up with Primary MD as planned this month.   Notification of test results are managed in the following manner: If there are  any recommendations or changes to the  plan of care discussed in office today,  we will contact you and let you know what they are. If you do not hear from [place], then your results are normal and you can view them through your  MyChart account , or a letter will be sent to you. Thank you again for trusting [place] with your care  [facility].

## 2020-03-06 NOTE — Patient Instructions (Signed)
Activity as tolerated, advance as able.  Follow up with Pulmonary as needed.  Labs today  Follow up with Primary MD as planned this month.   Notification of test results are managed in the following manner: If there are  any recommendations or changes to the  plan of care discussed in office today,  we will contact you and let you know what they are. If you do not hear from [place], then your results are normal and you can view them through your  MyChart account , or a letter will be sent to you. Thank you again for trusting [place] with your care  [facility].
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
## 2020-03-06 NOTE — Progress Notes (Signed)
Full PFT performed today. °

## 2020-03-09 DIAGNOSIS — M170 Bilateral primary osteoarthritis of knee: Secondary | ICD-10-CM | POA: Diagnosis not present

## 2020-03-09 LAB — ANA: Anti Nuclear Antibody (ANA): NEGATIVE

## 2020-03-09 LAB — RHEUMATOID FACTOR: Rheumatoid fact SerPl-aCnc: <14 IU/mL (ref <14)

## 2020-03-28 DIAGNOSIS — F3342 Major depressive disorder, recurrent, in full remission: Secondary | ICD-10-CM | POA: Diagnosis not present

## 2020-03-28 DIAGNOSIS — F411 Generalized anxiety disorder: Secondary | ICD-10-CM | POA: Diagnosis not present

## 2020-03-28 DIAGNOSIS — G4700 Insomnia, unspecified: Secondary | ICD-10-CM | POA: Diagnosis not present

## 2020-03-28 DIAGNOSIS — M5442 Lumbago with sciatica, left side: Secondary | ICD-10-CM | POA: Diagnosis not present

## 2020-03-28 DIAGNOSIS — Z0000 Encounter for general adult medical examination without abnormal findings: Secondary | ICD-10-CM | POA: Diagnosis not present

## 2020-03-28 DIAGNOSIS — E782 Mixed hyperlipidemia: Secondary | ICD-10-CM | POA: Diagnosis not present

## 2020-03-28 DIAGNOSIS — E559 Vitamin D deficiency, unspecified: Secondary | ICD-10-CM | POA: Diagnosis not present

## 2020-03-28 DIAGNOSIS — R7309 Other abnormal glucose: Secondary | ICD-10-CM | POA: Diagnosis not present

## 2020-04-04 DIAGNOSIS — N951 Menopausal and female climacteric states: Secondary | ICD-10-CM | POA: Diagnosis not present

## 2020-05-04 DIAGNOSIS — M5136 Other intervertebral disc degeneration, lumbar region: Secondary | ICD-10-CM | POA: Diagnosis not present

## 2020-05-04 DIAGNOSIS — M170 Bilateral primary osteoarthritis of knee: Secondary | ICD-10-CM | POA: Diagnosis not present

## 2020-05-12 ENCOUNTER — Other Ambulatory Visit: Payer: Self-pay | Admitting: Family Medicine

## 2020-05-12 DIAGNOSIS — Z1231 Encounter for screening mammogram for malignant neoplasm of breast: Secondary | ICD-10-CM

## 2020-05-17 ENCOUNTER — Other Ambulatory Visit: Payer: Self-pay

## 2020-05-17 ENCOUNTER — Ambulatory Visit
Admission: RE | Admit: 2020-05-17 | Discharge: 2020-05-17 | Disposition: A | Discharge status: Home / Self Care | Payer: Medicare HMO | Source: Ambulatory Visit | Attending: Family Medicine | Admitting: Family Medicine

## 2020-05-17 DIAGNOSIS — Z1231 Encounter for screening mammogram for malignant neoplasm of breast: Secondary | ICD-10-CM

## 2020-05-23 DIAGNOSIS — M5416 Radiculopathy, lumbar region: Secondary | ICD-10-CM | POA: Diagnosis not present

## 2020-06-14 NOTE — Telephone Encounter (Signed)
Patient sent email asking about inhalers   When I do the least little thing i get short breath and i hve  to stop for about 30 mintue then i start again. I dont have any type of inhaler. hope this helps  Dr. Chase Caller please advise

## 2020-06-16 NOTE — Telephone Encounter (Signed)
reviewec chrt  Agree with Tammy that dyspnea is unexplained and asthma/copd is doubtful and hence no inhaler    Plan  - do CPST bike test with EIB challenge (can she bike for 10 mni) - can use albuterol 2 puff prn for the time being - ROV with Tammy or me after CPST

## 2020-07-20 DIAGNOSIS — M5416 Radiculopathy, lumbar region: Secondary | ICD-10-CM | POA: Diagnosis not present

## 2020-07-20 DIAGNOSIS — M1612 Unilateral primary osteoarthritis, left hip: Secondary | ICD-10-CM | POA: Diagnosis not present

## 2020-08-03 DIAGNOSIS — M160 Bilateral primary osteoarthritis of hip: Secondary | ICD-10-CM | POA: Diagnosis not present

## 2020-08-03 DIAGNOSIS — M5416 Radiculopathy, lumbar region: Secondary | ICD-10-CM | POA: Diagnosis not present

## 2020-08-10 DIAGNOSIS — H938X3 Other specified disorders of ear, bilateral: Secondary | ICD-10-CM | POA: Diagnosis not present
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
## 2020-08-15 ENCOUNTER — Ambulatory Visit: Payer: Medicare HMO | Admitting: General Practice

## 2020-08-15 ENCOUNTER — Encounter: Payer: Self-pay | Admitting: General Practice

## 2020-08-15 ENCOUNTER — Other Ambulatory Visit: Payer: Self-pay

## 2020-08-15 VITALS — BP 126/78 | HR 76 | Ht 64.0 in | Wt 176.4 lb

## 2020-08-15 DIAGNOSIS — R0600 Dyspnea, unspecified: Secondary | ICD-10-CM | POA: Diagnosis not present

## 2020-08-15 DIAGNOSIS — I2510 Atherosclerotic heart disease of native coronary artery without angina pectoris: Secondary | ICD-10-CM | POA: Diagnosis not present

## 2020-08-15 DIAGNOSIS — E782 Mixed hyperlipidemia: Secondary | ICD-10-CM

## 2020-08-15 DIAGNOSIS — M170 Bilateral primary osteoarthritis of knee: Secondary | ICD-10-CM | POA: Diagnosis not present

## 2020-08-15 DIAGNOSIS — R0609 Other forms of dyspnea: Secondary | ICD-10-CM

## 2020-08-15 DIAGNOSIS — I351 Nonrheumatic aortic (valve) insufficiency: Secondary | ICD-10-CM

## 2020-08-15 NOTE — Patient Instructions (Addendum)
Medication Instructions:  The current medical regimen is effective;  continue present plan and medications as directed. Please refer to the Current Medication list given to you today.  *If you need a refill on your cardiac medications before your next appointment, please call your pharmacy*  Lab Work:   Testing/Procedures:  NONE    NONE  Special Instructions PLEASE READ AND FOLLOW SALTY 6-ATTACHED-1,800mg  daily  PLEASE INCREASE PHYSICAL ACTIVITY SLOWLY AS TOLERATED-150 MIN/WEEKLY  Follow-Up: Your next appointment:  6 month(s) In Person with [name], MD OR IF UNAVAILABLE JESSE CLEAVER, FNP-C   Please call our office 2 months in advance to schedule this appointment   At [facility], you and your health needs are our priority.  As part of our continuing mission to provide you with exceptional heart care, we have created designated Provider Care Teams.  These Care Teams include your primary Cardiologist (physician) and Advanced Practice Providers (APPs -  Physician Assistants and Nurse Practitioners) who all work together to provide you with the care you need, when you need it.            6 SALTY THINGS TO AVOID     1,800MG  DAILY

## 2020-08-30 DIAGNOSIS — M25552 Pain in left hip: Secondary | ICD-10-CM | POA: Diagnosis not present

## 2020-08-31 DIAGNOSIS — H524 Presbyopia: Secondary | ICD-10-CM | POA: Diagnosis not present

## 2020-08-31 DIAGNOSIS — H43813 Vitreous degeneration, bilateral: Secondary | ICD-10-CM | POA: Diagnosis not present

## 2020-08-31 DIAGNOSIS — H52203 Unspecified astigmatism, bilateral: Secondary | ICD-10-CM | POA: Diagnosis not present

## 2020-09-28 DIAGNOSIS — R42 Dizziness and giddiness: Secondary | ICD-10-CM | POA: Diagnosis not present

## 2020-09-28 DIAGNOSIS — M545 Low back pain, unspecified: Secondary | ICD-10-CM | POA: Diagnosis not present

## 2020-09-28 DIAGNOSIS — R000 Tachycardia, unspecified: Secondary | ICD-10-CM | POA: Diagnosis not present

## 2020-09-28 DIAGNOSIS — R110 Nausea: Secondary | ICD-10-CM | POA: Diagnosis not present

## 2020-09-28 DIAGNOSIS — G4700 Insomnia, unspecified: Secondary | ICD-10-CM | POA: Diagnosis not present

## 2020-10-12 ENCOUNTER — Emergency Department (HOSPITAL_COMMUNITY)
Admission: EM | Admit: 2020-10-12 | Discharge: 2020-10-12 | Disposition: A | Discharge status: Home / Self Care | Payer: Medicare HMO | Attending: Emergency Medicine | Admitting: Emergency Medicine

## 2020-10-12 ENCOUNTER — Encounter (HOSPITAL_COMMUNITY): Payer: Self-pay

## 2020-10-12 ENCOUNTER — Emergency Department (HOSPITAL_COMMUNITY): Payer: Medicare HMO

## 2020-10-12 ENCOUNTER — Other Ambulatory Visit: Payer: Self-pay

## 2020-10-12 DIAGNOSIS — Z7982 Long term (current) use of aspirin: Secondary | ICD-10-CM | POA: Diagnosis not present

## 2020-10-12 DIAGNOSIS — I10 Essential (primary) hypertension: Secondary | ICD-10-CM | POA: Diagnosis not present

## 2020-10-12 DIAGNOSIS — R519 Headache, unspecified: Secondary | ICD-10-CM | POA: Insufficient documentation

## 2020-10-12 DIAGNOSIS — R4182 Altered mental status, unspecified: Secondary | ICD-10-CM | POA: Diagnosis not present

## 2020-10-12 DIAGNOSIS — R42 Dizziness and giddiness: Secondary | ICD-10-CM | POA: Insufficient documentation

## 2020-10-12 LAB — CBC WITH DIFFERENTIAL/PLATELET
Abs Immature Granulocytes: 0.02 10*3/uL (ref 0.00–0.07)
Basophils Absolute: 0.1 10*3/uL (ref 0.0–0.1)
Basophils Relative: 1 %
Eosinophils Absolute: 0.1 10*3/uL (ref 0.0–0.5)
Eosinophils Relative: 1 %
HCT: 44.1 % (ref 36.0–46.0)
Hemoglobin: 14.4 g/dL (ref 12.0–15.0)
Immature Granulocytes: 0 %
Lymphocytes Relative: 20 %
Lymphs Abs: 1.7 10*3/uL (ref 0.7–4.0)
MCH: 29.4 pg (ref 26.0–34.0)
MCHC: 32.7 g/dL (ref 30.0–36.0)
MCV: 90 fL (ref 80.0–100.0)
Monocytes Absolute: 0.8 10*3/uL (ref 0.1–1.0)
Monocytes Relative: 9 %
Neutro Abs: 6.1 10*3/uL (ref 1.7–7.7)
Neutrophils Relative %: 69 %
Platelets: 304 10*3/uL (ref 150–400)
RBC: 4.9 MIL/uL (ref 3.87–5.11)
RDW: 13.3 % (ref 11.5–15.5)
WBC: 8.8 10*3/uL (ref 4.0–10.5)
nRBC: 0 % (ref 0.0–0.2)

## 2020-10-12 LAB — COMPREHENSIVE METABOLIC PANEL
ALT: 23 U/L (ref 0–44)
AST: 26 U/L (ref 15–41)
Albumin: 4.3 g/dL (ref 3.5–5.0)
Alkaline Phosphatase: 79 U/L (ref 38–126)
Anion gap: 6 (ref 5–15)
BUN: 25 mg/dL — ABNORMAL HIGH (ref 8–23)
CO2: 25 mmol/L (ref 22–32)
Calcium: 10.8 mg/dL — ABNORMAL HIGH (ref 8.9–10.3)
Chloride: 104 mmol/L (ref 98–111)
Creatinine, Ser: 0.88 mg/dL (ref 0.44–1.00)
GFR, Estimated: >60 mL/min (ref >60)
Glucose, Bld: 121 mg/dL — ABNORMAL HIGH (ref 70–99)
Potassium: 4.2 mmol/L (ref 3.5–5.1)
Sodium: 135 mmol/L (ref 135–145)
Total Bilirubin: 0.6 mg/dL (ref 0.3–1.2)
Total Protein: 7.1 g/dL (ref 6.5–8.1)

## 2020-10-12 LAB — TSH: TSH: 1.072 u[IU]/mL (ref 0.350–4.500)

## 2020-10-12 LAB — SEDIMENTATION RATE: Sed Rate: 12 mm/hr (ref 0–22)

## 2020-10-12 MED ORDER — PREDNISONE 20 MG PO TABS: 20.0000 mg | ORAL_TABLET | Freq: Every day | ORAL | 0 refills | Status: AC

## 2020-10-12 MED ORDER — SODIUM CHLORIDE 0.9 % IV BOLUS
500.0000 mL | Freq: Once | INTRAVENOUS | Status: AC
  Administered 2020-10-12: 500 mL via INTRAVENOUS

## 2020-10-12 MED ORDER — DEXAMETHASONE SODIUM PHOSPHATE 4 MG/ML IJ SOLN
4.0000 mg | Freq: Once | INTRAMUSCULAR | Status: AC
  Administered 2020-10-12: 4 mg via INTRAVENOUS
  Filled 2020-10-12: qty 1

## 2020-10-12 MED ORDER — METOCLOPRAMIDE HCL 5 MG/ML IJ SOLN
10.0000 mg | Freq: Once | INTRAMUSCULAR | Status: AC
  Administered 2020-10-12: 10 mg via INTRAVENOUS
  Filled 2020-10-12: qty 2

## 2020-10-12 NOTE — Discharge Instructions (Signed)
As discussed, your evaluation today has been largely reassuring.  But, it is important that you monitor your condition carefully, and do not hesitate to return to the ED if you develop new, or concerning changes in your condition. ? ?Otherwise, please follow-up with your physician for appropriate ongoing care. ? ?

## 2020-10-12 NOTE — ED Triage Notes (Signed)
Pt presents with c/o headache for several months with some associated dizziness.

## 2020-10-12 NOTE — ED Provider Notes (Signed)
Emergency Medicine Provider Triage Evaluation Note  Christina Melendez , a 75 y.o. female  was evaluated in triage.  Pt complains of headache and dizziness.  Patient reports that she has had headaches intermittently over the last 2 months.  Pain is primarily located to her right temple.  Headache onset is gradual.  Develops dizziness and palpitations related to her headaches.  Patient endorses headache at present.  Review of Systems  Positive: Headache, dizziness, palpitations Negative: Fever, chills, facial asymmetry, weakness to extremities, numbness to extremities, visual disturbance  Physical Exam  BP (!) 148/70 (BP Location: Left Arm)   Pulse 100   Temp 97.9 F (36.6 C)   Resp 16   SpO2 99%  Gen:   Awake, no distress   Resp:  Normal effort  MSK:   Moves extremities without difficulty  Other:  No facial asymmetry, patient moves all limbs equally without difficulty, grip strength equal  Medical Decision Making  Medically screening exam initiated at 12:13 PM.  Appropriate orders placed.  Christina Melendez was informed that the remainder of the evaluation will be completed by another provider, this initial triage assessment does not replace that evaluation, and the importance of remaining in the ED until their evaluation is complete.  The patient appears stable so that the remainder of the work up may be completed by another provider.      Dyann Ruddle 10/12/20 1215    Truddie Hidden, MD 10/12/20 [phone]

## 2020-10-12 NOTE — ED Notes (Signed)
Family at bedside. 
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
## 2020-10-12 NOTE — ED Notes (Signed)
Pt not found in lobby when called for repeat vitals.

## 2020-10-18 ENCOUNTER — Telehealth: Payer: Self-pay | Admitting: Cardiovascular Disease

## 2020-10-18 NOTE — Telephone Encounter (Signed)
STAT if HR is under 50 or over 120 (normal HR is 60-100 beats per minute)  1) What is your heart rate? Not sure  2) Do you have a log of your heart rate readings (document readings)? 90's the other day  3) Do you have any other symptoms? Dizziness, but has not had it today  Patient states her heart has been racing and she gets dizziness. She states she has not had the dizziness today. She states she was seen in the ED on Thursday for her dizziness and headache. She states they did a CT, but nothing showed up.

## 2020-10-18 NOTE — Telephone Encounter (Signed)
Called patient she states that she is having racing heart beat. Patient states that her HR was 98 yesterday and she feels like her heart is beating so hard and it is going to beat out of her chest. Patient denies chest pain, shortness of breath or swelling. She states that she does not have BP issues, it has been good. Patient states she wants something to slow her heart down. She was recently seen in the ED for a headache,. They did an EKG HR was 83, but they recommended she see Neuro for her headaches and dizziness.  Patient was advised to call to follow up with them, she will do this. Will route to MD as requested by patient.

## 2020-10-20 ENCOUNTER — Other Ambulatory Visit: Payer: Self-pay

## 2020-10-20 DIAGNOSIS — R002 Palpitations: Secondary | ICD-10-CM

## 2020-10-20 NOTE — Telephone Encounter (Signed)
Called patient, advised of message from MD.  Elwyn Reach ordered- patient aware, office visit made with Sande Rives PA-C in July.  Patient verbalized understanding

## 2020-10-21 DIAGNOSIS — R519 Headache, unspecified: Secondary | ICD-10-CM | POA: Diagnosis not present

## 2020-10-21 DIAGNOSIS — R000 Tachycardia, unspecified: Secondary | ICD-10-CM | POA: Diagnosis not present

## 2020-10-23 ENCOUNTER — Encounter: Payer: Self-pay | Admitting: Neurology

## 2020-10-23 ENCOUNTER — Ambulatory Visit (INDEPENDENT_AMBULATORY_CARE_PROVIDER_SITE_OTHER): Payer: Medicare HMO

## 2020-10-23 DIAGNOSIS — R002 Palpitations: Secondary | ICD-10-CM

## 2020-10-26 DIAGNOSIS — R002 Palpitations: Secondary | ICD-10-CM | POA: Diagnosis not present

## 2020-11-09 DIAGNOSIS — M25562 Pain in left knee: Secondary | ICD-10-CM | POA: Diagnosis not present

## 2020-11-09 DIAGNOSIS — M1612 Unilateral primary osteoarthritis, left hip: Secondary | ICD-10-CM | POA: Diagnosis not present

## 2020-11-13 DIAGNOSIS — H6981 Other specified disorders of Eustachian tube, right ear: Secondary | ICD-10-CM | POA: Diagnosis not present

## 2020-11-13 DIAGNOSIS — R519 Headache, unspecified: Secondary | ICD-10-CM | POA: Diagnosis not present

## 2020-11-13 DIAGNOSIS — R42 Dizziness and giddiness: Secondary | ICD-10-CM | POA: Diagnosis not present

## 2020-11-15 DIAGNOSIS — M170 Bilateral primary osteoarthritis of knee: Secondary | ICD-10-CM | POA: Diagnosis not present

## 2020-11-15 DIAGNOSIS — R002 Palpitations: Secondary | ICD-10-CM | POA: Diagnosis not present

## 2020-11-20 DIAGNOSIS — G4700 Insomnia, unspecified: Secondary | ICD-10-CM | POA: Diagnosis not present

## 2020-11-20 DIAGNOSIS — E782 Mixed hyperlipidemia: Secondary | ICD-10-CM | POA: Diagnosis not present

## 2020-11-20 DIAGNOSIS — G8929 Other chronic pain: Secondary | ICD-10-CM | POA: Diagnosis not present

## 2020-11-20 DIAGNOSIS — F3342 Major depressive disorder, recurrent, in full remission: Secondary | ICD-10-CM | POA: Diagnosis not present

## 2020-11-20 DIAGNOSIS — M1990 Unspecified osteoarthritis, unspecified site: Secondary | ICD-10-CM | POA: Diagnosis not present

## 2020-11-20 DIAGNOSIS — M1612 Unilateral primary osteoarthritis, left hip: Secondary | ICD-10-CM | POA: Diagnosis not present

## 2020-11-20 DIAGNOSIS — K219 Gastro-esophageal reflux disease without esophagitis: Secondary | ICD-10-CM | POA: Diagnosis not present

## 2020-11-20 DIAGNOSIS — M150 Primary generalized (osteo)arthritis: Secondary | ICD-10-CM | POA: Diagnosis not present

## 2020-11-20 DIAGNOSIS — M810 Age-related osteoporosis without current pathological fracture: Secondary | ICD-10-CM | POA: Diagnosis not present
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
## 2020-12-20 DIAGNOSIS — Z79899 Other long term (current) drug therapy: Secondary | ICD-10-CM | POA: Diagnosis not present

## 2020-12-20 DIAGNOSIS — M5136 Other intervertebral disc degeneration, lumbar region: Secondary | ICD-10-CM | POA: Diagnosis not present

## 2020-12-21 ENCOUNTER — Other Ambulatory Visit: Payer: Self-pay

## 2020-12-21 ENCOUNTER — Ambulatory Visit: Payer: Medicare HMO | Admitting: Physician Assistant

## 2020-12-21 ENCOUNTER — Encounter: Payer: Self-pay | Admitting: Physician Assistant

## 2020-12-21 VITALS — BP 116/64 | HR 92 | Ht 64.0 in | Wt 171.2 lb

## 2020-12-21 DIAGNOSIS — I2510 Atherosclerotic heart disease of native coronary artery without angina pectoris: Secondary | ICD-10-CM | POA: Diagnosis not present

## 2020-12-21 DIAGNOSIS — E782 Mixed hyperlipidemia: Secondary | ICD-10-CM

## 2020-12-21 DIAGNOSIS — I491 Atrial premature depolarization: Secondary | ICD-10-CM

## 2020-12-21 DIAGNOSIS — R002 Palpitations: Secondary | ICD-10-CM | POA: Diagnosis not present

## 2020-12-21 MED ORDER — METOPROLOL TARTRATE 50 MG PO TABS: 50.0000 mg | ORAL_TABLET | Freq: Two times a day (BID) | ORAL | 5 refills | Status: DC

## 2020-12-21 NOTE — Patient Instructions (Signed)
Medication Instructions:  Start Metoprolol Tartrate 50 mg ( !Tablet Twice Daily) *If you need a refill on your cardiac medications before your next appointment, please call your pharmacy*   Lab Work: Lipid Panel (To Be Done in October At [facility]) If you have labs (blood work) drawn today and your tests are completely normal, you will receive your results only by: Christina Melendez (if you have MyChart) OR A paper copy in the mail If you have any lab test that is abnormal or we need to change your treatment, we will call you to review the results.   Testing/Procedures: No Testing    Follow-Up: At [facility], you and your health needs are our priority.  As part of our continuing mission to provide you with exceptional heart care, we have created designated Provider Care Teams.  These Care Teams include your primary Cardiologist (physician) and Advanced Practice Providers (APPs -  Physician Assistants and Nurse Practitioners) who all work together to provide you with the care you need, when you need it.  We recommend signing up for the patient portal called "MyChart".  Sign up information is provided on this After Visit Summary.  MyChart is used to connect with patients for Virtual Visits (Telemedicine).  Patients are able to view lab/test results, encounter notes, upcoming appointments, etc.  Non-urgent messages can be sent to your provider as well.   To learn more about what you can do with MyChart, go to [email or web address].    Your next appointment:   6 month(s)  The format for your next appointment:   In Person  Provider:   [name], MD  [email or web address]">  DASH Eating Plan DASH stands for Dietary Approaches to Stop Hypertension. The DASH eating plan is a healthy eating plan that has been shown to: Reduce high blood pressure (hypertension). Reduce your risk for type 2 diabetes, heart disease, and stroke. Help  with weight loss. What are tips for following this plan? Reading food labels Check food labels for the amount of salt (sodium) per serving. Choose foods with less than 5 percent of the Daily Value of sodium. Generally, foods with less than 300 milligrams (mg) of sodium per serving fit into this eating plan. To find whole grains, look for the word "whole" as the first word in the ingredient list. Shopping Buy products labeled as "low-sodium" or "no salt added." Buy fresh foods. Avoid canned foods and pre-made or frozen meals. Cooking Avoid adding salt when cooking. Use salt-free seasonings or herbs instead of table salt or sea salt. Check with your health care provider or pharmacist before using salt substitutes. Do not fry foods. Cook foods using healthy methods such as baking, boiling, grilling, roasting, and broiling instead. Cook with heart-healthy oils, such as olive, canola, avocado, soybean, or sunflower oil. Meal planning  Eat a balanced diet that includes: 4 or more servings of fruits and 4 or more servings of vegetables each day. Try to fill one-half of your plate with fruits and vegetables. 6-8 servings of whole grains each day. Less than 6 oz (170 g) of lean meat, poultry, or fish each day. A 3-oz (85-g) serving of meat is about the same size as a deck of cards. One egg equals 1 oz (28 g). 2-3 servings of low-fat dairy each day. One serving is 1 cup (237 mL). 1 serving of nuts, seeds, or beans 5 times each week. 2-3 servings of heart-healthy fats. Healthy fats called omega-3 fatty acids are found in foods such as  walnuts, flaxseeds, fortified milks, and eggs. These fats are also found in cold-water fish, such as sardines, salmon, and mackerel. Limit how much you eat of: Canned or prepackaged foods. Food that is high in trans fat, such as some fried foods. Food that is high in saturated fat, such as fatty meat. Desserts and other sweets, sugary drinks, and other foods with added  sugar. Full-fat dairy products. Do not salt foods before eating. Do not eat more than 4 egg yolks a week. Try to eat at least 2 vegetarian meals a week. Eat more home-cooked food and less restaurant, buffet, and fast food.  Lifestyle When eating at a restaurant, ask that your food be prepared with less salt or no salt, if possible. If you drink alcohol: Limit how much you use to: 0-1 drink a day for women who are not pregnant. 0-2 drinks a day for men. Be aware of how much alcohol is in your drink. In the [place] one drink equals one 12 oz bottle of beer (355 mL), one 5 oz glass of wine (148 mL), or one 1 oz glass of hard liquor (44 mL). General information Avoid eating more than 2,300 mg of salt a day. If you have hypertension, you may need to reduce your sodium intake to 1,500 mg a day. Work with your health care provider to maintain a healthy body weight or to lose weight. Ask what an ideal weight is for you. Get at least 30 minutes of exercise that causes your heart to beat faster (aerobic exercise) most days of the week. Activities may include walking, swimming, or biking. Work with your health care provider or dietitian to adjust your eating plan to your individual calorie needs. What foods should I eat? Fruits All fresh, dried, or frozen fruit. Canned fruit in natural juice (without addedsugar). Vegetables Fresh or frozen vegetables (raw, steamed, roasted, or grilled). Low-sodium or reduced-sodium tomato and vegetable juice. Low-sodium or reduced-sodium tomatosauce and tomato paste. Low-sodium or reduced-sodium canned vegetables. Grains Whole-grain or whole-wheat bread. Whole-grain or whole-wheat pasta. Brown rice. [place]row. Bulgur. Whole-grain and low-sodium cereals. Pita bread.Low-fat, low-sodium crackers. Whole-wheat flour tortillas. Meats and other proteins Skinless chicken or [place]. Ground chicken or [place]. Pork with fat trimmed off. Fish and seafood. Egg whites. Dried  beans, peas, or lentils. Unsalted nuts, nut butters, and seeds. Unsalted canned beans. Lean cuts of beef with fat trimmed off. Low-sodium, lean precooked or cured meat, such as sausages or meatloaves. Dairy Low-fat (1%) or fat-free (skim) milk. Reduced-fat, low-fat, or fat-free cheeses. Nonfat, low-sodium ricotta or cottage cheese. Low-fat or nonfatyogurt. Low-fat, low-sodium cheese. Fats and oils Soft margarine without trans fats. Vegetable oil. Reduced-fat, low-fat, or light mayonnaise and salad dressings (reduced-sodium). Canola, safflower, olive, avocado, soybean, andsunflower oils. Avocado. Seasonings and condiments Herbs. Spices. Seasoning mixes without salt. Other foods Unsalted popcorn and pretzels. Fat-free sweets. The items listed above may not be a complete list of foods and beverages you can eat. Contact a dietitian for more information. What foods should I avoid? Fruits Canned fruit in a light or heavy syrup. Fried fruit. Fruit in cream or buttersauce. Vegetables Creamed or fried vegetables. Vegetables in a cheese sauce. Regular canned vegetables (not low-sodium or reduced-sodium). Regular canned tomato sauce and paste (not low-sodium or reduced-sodium). Regular tomato and vegetable juice(not low-sodium or reduced-sodium). Angie Fava. Olives. Grains Baked goods made with fat, such as croissants, muffins, or some breads. Drypasta or rice meal packs. Meats and other proteins Fatty cuts of meat. Ribs. Maceo Pro  meat. Berniece Salines. Bologna, salami, and other precooked or cured meats, such as sausages or meat loaves. Fat from the back of a pig (fatback). Bratwurst. Salted nuts and seeds. Canned beans with added salt. Canned orsmoked fish. Whole eggs or egg yolks. Chicken or [place] with skin. Dairy Whole or 2% milk, cream, and half-and-half. Whole or full-fat cream cheese. Whole-fat or sweetened yogurt. Full-fat cheese. Nondairy creamers. Whippedtoppings. Processed cheese and cheese spreads. Fats and  oils Butter. Stick margarine. Lard. Shortening. Ghee. Bacon fat. Tropical oils, suchas coconut, palm kernel, or palm oil. Seasonings and condiments Onion salt, garlic salt, seasoned salt, table salt, and sea salt. Worcestershire sauce. Tartar sauce. Barbecue sauce. Teriyaki sauce. Soy sauce, including reduced-sodium. Steak sauce. Canned and packaged gravies. Fish sauce. Oyster sauce. Cocktail sauce. Store-bought horseradish. Ketchup. Mustard. Meat flavorings and tenderizers. Bouillon cubes. Hot sauces. Pre-made or packaged marinades. Pre-made or packaged taco seasonings. Relishes. Regular saladdressings. Other foods Salted popcorn and pretzels. The items listed above may not be a complete list of foods and beverages you should avoid. Contact a dietitian for more information. Where to find more information National Heart, Lung, and Blood Institute: https://wilson-eaton.com/ American Heart Association: www.heart.org Academy of Nutrition and Dietetics: www.eatright.[place]: www.kidney.org Summary The DASH eating plan is a healthy eating plan that has been shown to reduce high blood pressure (hypertension). It may also reduce your risk for type 2 diabetes, heart disease, and stroke. When on the DASH eating plan, aim to eat more fresh fruits and vegetables, whole grains, lean proteins, low-fat dairy, and heart-healthy fats. With the DASH eating plan, you should limit salt (sodium) intake to 2,300 mg a day. If you have hypertension, you may need to reduce your sodium intake to 1,500 mg a day. Work with your health care provider or dietitian to adjust your eating plan to your individual calorie needs. This information is not intended to replace advice given to you by your health care provider. Make sure you discuss any questions you have with your healthcare provider. Document Revised: 04/23/2019 Document Reviewed: 04/23/2019 Elsevier Patient Education  2022 [employer].

## 2020-12-25 DIAGNOSIS — M810 Age-related osteoporosis without current pathological fracture: Secondary | ICD-10-CM | POA: Diagnosis not present

## 2020-12-25 DIAGNOSIS — K219 Gastro-esophageal reflux disease without esophagitis: Secondary | ICD-10-CM | POA: Diagnosis not present

## 2020-12-25 DIAGNOSIS — F3342 Major depressive disorder, recurrent, in full remission: Secondary | ICD-10-CM | POA: Diagnosis not present

## 2020-12-25 DIAGNOSIS — M150 Primary generalized (osteo)arthritis: Secondary | ICD-10-CM | POA: Diagnosis not present

## 2020-12-25 DIAGNOSIS — E782 Mixed hyperlipidemia: Secondary | ICD-10-CM | POA: Diagnosis not present

## 2020-12-25 DIAGNOSIS — G8929 Other chronic pain: Secondary | ICD-10-CM | POA: Diagnosis not present

## 2020-12-25 DIAGNOSIS — M1990 Unspecified osteoarthritis, unspecified site: Secondary | ICD-10-CM | POA: Diagnosis not present

## 2020-12-25 DIAGNOSIS — G4700 Insomnia, unspecified: Secondary | ICD-10-CM | POA: Diagnosis not present

## 2020-12-27 DIAGNOSIS — M170 Bilateral primary osteoarthritis of knee: Secondary | ICD-10-CM | POA: Diagnosis not present

## 2020-12-27 DIAGNOSIS — M1712 Unilateral primary osteoarthritis, left knee: Secondary | ICD-10-CM | POA: Diagnosis not present
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
## 2020-12-29 ENCOUNTER — Encounter: Payer: Self-pay | Admitting: Neurology

## 2020-12-29 ENCOUNTER — Other Ambulatory Visit: Payer: Self-pay

## 2020-12-29 ENCOUNTER — Ambulatory Visit: Payer: Medicare HMO | Admitting: Neurology

## 2020-12-29 ENCOUNTER — Other Ambulatory Visit (INDEPENDENT_AMBULATORY_CARE_PROVIDER_SITE_OTHER): Payer: Medicare HMO

## 2020-12-29 VITALS — BP 104/67 | HR 73 | Ht 64.0 in | Wt 171.2 lb

## 2020-12-29 DIAGNOSIS — R42 Dizziness and giddiness: Secondary | ICD-10-CM | POA: Diagnosis not present

## 2020-12-29 DIAGNOSIS — R519 Headache, unspecified: Secondary | ICD-10-CM | POA: Diagnosis not present

## 2020-12-29 LAB — C-REACTIVE PROTEIN: CRP: <1 mg/dL (ref 0.5–20.0)

## 2020-12-29 LAB — SEDIMENTATION RATE: Sed Rate: 10 mm/hr (ref 0–30)

## 2020-12-29 NOTE — Patient Instructions (Addendum)
We will check a sed rate and CRP.  Further recommendations pending results.  If negative, I think the headache is part of a bigger issue, such as the heart. Your provider has requested that you have labwork completed today. Please go to [facility] ([address]) on the second floor of this building before leaving the office today. You do not need to check in. If you are not called within 15 minutes please check with the front desk.

## 2021-01-02 NOTE — Progress Notes (Signed)
Spoken to patient and notified Dr Georgie Chard  comments. Verbalized understanding.

## 2021-01-10 DIAGNOSIS — M25561 Pain in right knee: Secondary | ICD-10-CM | POA: Diagnosis not present

## 2021-01-10 DIAGNOSIS — M25562 Pain in left knee: Secondary | ICD-10-CM | POA: Diagnosis not present

## 2021-01-16 DIAGNOSIS — M170 Bilateral primary osteoarthritis of knee: Secondary | ICD-10-CM | POA: Diagnosis not present

## 2021-01-26 DIAGNOSIS — F3342 Major depressive disorder, recurrent, in full remission: Secondary | ICD-10-CM | POA: Diagnosis not present

## 2021-01-26 DIAGNOSIS — F411 Generalized anxiety disorder: Secondary | ICD-10-CM | POA: Diagnosis not present

## 2021-01-26 DIAGNOSIS — G4700 Insomnia, unspecified: Secondary | ICD-10-CM | POA: Diagnosis not present

## 2021-01-30 DIAGNOSIS — M170 Bilateral primary osteoarthritis of knee: Secondary | ICD-10-CM | POA: Diagnosis not present

## 2021-01-30 DIAGNOSIS — M1711 Unilateral primary osteoarthritis, right knee: Secondary | ICD-10-CM | POA: Diagnosis not present

## 2021-02-20 DIAGNOSIS — M1712 Unilateral primary osteoarthritis, left knee: Secondary | ICD-10-CM | POA: Diagnosis not present

## 2021-02-20 DIAGNOSIS — M1612 Unilateral primary osteoarthritis, left hip: Secondary | ICD-10-CM | POA: Diagnosis not present

## 2021-02-20 DIAGNOSIS — M170 Bilateral primary osteoarthritis of knee: Secondary | ICD-10-CM | POA: Diagnosis not present

## 2021-03-02 DIAGNOSIS — G4700 Insomnia, unspecified: Secondary | ICD-10-CM | POA: Diagnosis not present

## 2021-03-02 DIAGNOSIS — E782 Mixed hyperlipidemia: Secondary | ICD-10-CM | POA: Diagnosis not present

## 2021-03-02 DIAGNOSIS — G8929 Other chronic pain: Secondary | ICD-10-CM | POA: Diagnosis not present

## 2021-03-02 DIAGNOSIS — M150 Primary generalized (osteo)arthritis: Secondary | ICD-10-CM | POA: Diagnosis not present

## 2021-03-02 DIAGNOSIS — M1990 Unspecified osteoarthritis, unspecified site: Secondary | ICD-10-CM | POA: Diagnosis not present

## 2021-03-02 DIAGNOSIS — F3342 Major depressive disorder, recurrent, in full remission: Secondary | ICD-10-CM | POA: Diagnosis not present

## 2021-03-02 DIAGNOSIS — K219 Gastro-esophageal reflux disease without esophagitis: Secondary | ICD-10-CM | POA: Diagnosis not present

## 2021-03-12 DIAGNOSIS — M5136 Other intervertebral disc degeneration, lumbar region: Secondary | ICD-10-CM | POA: Diagnosis not present

## 2021-03-16 DIAGNOSIS — M1711 Unilateral primary osteoarthritis, right knee: Secondary | ICD-10-CM | POA: Diagnosis not present

## 2021-03-16 DIAGNOSIS — M1712 Unilateral primary osteoarthritis, left knee: Secondary | ICD-10-CM | POA: Diagnosis not present

## 2021-03-16 DIAGNOSIS — M170 Bilateral primary osteoarthritis of knee: Secondary | ICD-10-CM | POA: Diagnosis not present

## 2021-03-16 DIAGNOSIS — S82401D Unspecified fracture of shaft of right fibula, subsequent encounter for closed fracture with routine healing: Secondary | ICD-10-CM | POA: Diagnosis not present

## 2021-03-30 DIAGNOSIS — M1711 Unilateral primary osteoarthritis, right knee: Secondary | ICD-10-CM | POA: Diagnosis not present

## 2021-03-30 DIAGNOSIS — M25562 Pain in left knee: Secondary | ICD-10-CM | POA: Diagnosis not present

## 2021-03-30 DIAGNOSIS — M170 Bilateral primary osteoarthritis of knee: Secondary | ICD-10-CM | POA: Diagnosis not present

## 2021-03-30 DIAGNOSIS — M1712 Unilateral primary osteoarthritis, left knee: Secondary | ICD-10-CM | POA: Diagnosis not present

## 2021-04-03 DIAGNOSIS — M25562 Pain in left knee: Secondary | ICD-10-CM | POA: Diagnosis not present

## 2021-04-04 DIAGNOSIS — Z0000 Encounter for general adult medical examination without abnormal findings: Secondary | ICD-10-CM | POA: Diagnosis not present

## 2021-04-04 DIAGNOSIS — M810 Age-related osteoporosis without current pathological fracture: Secondary | ICD-10-CM | POA: Diagnosis not present

## 2021-04-04 DIAGNOSIS — K219 Gastro-esophageal reflux disease without esophagitis: Secondary | ICD-10-CM | POA: Diagnosis not present

## 2021-04-04 DIAGNOSIS — I700 Atherosclerosis of aorta: Secondary | ICD-10-CM | POA: Diagnosis not present

## 2021-04-04 DIAGNOSIS — G4700 Insomnia, unspecified: Secondary | ICD-10-CM | POA: Diagnosis not present

## 2021-04-04 DIAGNOSIS — E559 Vitamin D deficiency, unspecified: Secondary | ICD-10-CM | POA: Diagnosis not present

## 2021-04-04 DIAGNOSIS — E782 Mixed hyperlipidemia: Secondary | ICD-10-CM | POA: Diagnosis not present

## 2021-04-04 DIAGNOSIS — F3342 Major depressive disorder, recurrent, in full remission: Secondary | ICD-10-CM | POA: Diagnosis not present

## 2021-04-10 ENCOUNTER — Other Ambulatory Visit: Payer: Self-pay | Admitting: Family Medicine

## 2021-04-10 DIAGNOSIS — M8580 Other specified disorders of bone density and structure, unspecified site: Secondary | ICD-10-CM

## 2021-04-13 DIAGNOSIS — M170 Bilateral primary osteoarthritis of knee: Secondary | ICD-10-CM | POA: Diagnosis not present

## 2021-04-19 DIAGNOSIS — H6981 Other specified disorders of Eustachian tube, right ear: Secondary | ICD-10-CM | POA: Diagnosis not present

## 2021-04-23 ENCOUNTER — Telehealth: Payer: Self-pay | Admitting: *Deleted

## 2021-04-23 DIAGNOSIS — F3342 Major depressive disorder, recurrent, in full remission: Secondary | ICD-10-CM | POA: Diagnosis not present

## 2021-04-23 DIAGNOSIS — M1990 Unspecified osteoarthritis, unspecified site: Secondary | ICD-10-CM | POA: Diagnosis not present

## 2021-04-23 DIAGNOSIS — E782 Mixed hyperlipidemia: Secondary | ICD-10-CM | POA: Diagnosis not present

## 2021-04-23 DIAGNOSIS — K219 Gastro-esophageal reflux disease without esophagitis: Secondary | ICD-10-CM | POA: Diagnosis not present

## 2021-04-23 DIAGNOSIS — M810 Age-related osteoporosis without current pathological fracture: Secondary | ICD-10-CM | POA: Diagnosis not present

## 2021-04-23 DIAGNOSIS — M150 Primary generalized (osteo)arthritis: Secondary | ICD-10-CM | POA: Diagnosis not present

## 2021-04-23 DIAGNOSIS — G4700 Insomnia, unspecified: Secondary | ICD-10-CM | POA: Diagnosis not present

## 2021-04-23 DIAGNOSIS — G8929 Other chronic pain: Secondary | ICD-10-CM | POA: Diagnosis not present

## 2021-04-23 NOTE — Telephone Encounter (Signed)
   [facility] Pre-operative Risk Assessment    Patient Name: Christina Melendez  DOB: [birth year] MRN: [id number]  HEARTCARE STAFF:  - IMPORTANT!!!!!! Under Visit Info/Reason for Call, type in Other and utilize the format Clearance MM/DD/YY or Clearance TBD. Do not use dashes or single digits. - Please review there is not already an duplicate clearance open for this procedure. - If request is for dental extraction, please clarify the # of teeth to be extracted. - If the patient is currently at the dentist's office, call Pre-Op Callback Staff (MA/nurse) to input urgent request.  - If the patient is not currently in the dentist office, please route to the Pre-Op pool.  Request for surgical clearance:  What type of surgery is being performed? Left total knee arthroplasty  When is this surgery scheduled? 08/13/21  What type of clearance is required (medical clearance vs. Pharmacy clearance to hold med vs. Both)? both  Are there any medications that need to be held prior to surgery and how long? Aspirin-need direction  Practice name and name of physician performing surgery? [facility]  What is the office phone number? [phone]   7.   What is the office fax number? [phone]  8.   Anesthesia type (None, local, MAC, general) ? choice   Fredia Beets 04/23/2021, 7:59 AM  _________________________________________________________________   (provider comments below)

## 2021-04-23 NOTE — Telephone Encounter (Signed)
    Patient Name: Christina Melendez  DOB: Dec 22, 1945 MRN: [id number]  Primary Cardiologist: [name], MD  Chart reviewed as part of pre-operative protocol coverage.  Surgery is so far away. Please send request 2 months prior.   Please call with questions.  [place], [place] 04/23/2021, 9:31 AM

## 2021-05-18 DIAGNOSIS — M1712 Unilateral primary osteoarthritis, left knee: Secondary | ICD-10-CM | POA: Diagnosis not present

## 2021-05-22 DIAGNOSIS — Z01818 Encounter for other preprocedural examination: Secondary | ICD-10-CM | POA: Diagnosis not present

## 2021-05-22 DIAGNOSIS — N182 Chronic kidney disease, stage 2 (mild): Secondary | ICD-10-CM | POA: Diagnosis not present

## 2021-05-22 DIAGNOSIS — M1712 Unilateral primary osteoarthritis, left knee: Secondary | ICD-10-CM | POA: Diagnosis not present

## 2021-05-22 DIAGNOSIS — Z7901 Long term (current) use of anticoagulants: Secondary | ICD-10-CM | POA: Diagnosis not present

## 2021-05-22 DIAGNOSIS — R5382 Chronic fatigue, unspecified: Secondary | ICD-10-CM | POA: Diagnosis not present

## 2021-05-22 DIAGNOSIS — I700 Atherosclerosis of aorta: Secondary | ICD-10-CM | POA: Diagnosis not present

## 2021-05-30 ENCOUNTER — Other Ambulatory Visit: Payer: Self-pay | Admitting: Family Medicine

## 2021-05-30 DIAGNOSIS — Z1231 Encounter for screening mammogram for malignant neoplasm of breast: Secondary | ICD-10-CM

## 2021-05-31 ENCOUNTER — Ambulatory Visit
Admission: RE | Admit: 2021-05-31 | Discharge: 2021-05-31 | Disposition: A | Discharge status: Home / Self Care | Payer: Medicare HMO | Source: Ambulatory Visit | Attending: Family Medicine | Admitting: Family Medicine

## 2021-05-31 DIAGNOSIS — Z1231 Encounter for screening mammogram for malignant neoplasm of breast: Secondary | ICD-10-CM | POA: Diagnosis not present

## 2021-06-01 ENCOUNTER — Other Ambulatory Visit: Payer: Self-pay

## 2021-06-01 ENCOUNTER — Ambulatory Visit
Admission: RE | Admit: 2021-06-01 | Discharge: 2021-06-01 | Disposition: A | Discharge status: Home / Self Care | Payer: Medicare HMO | Source: Ambulatory Visit | Attending: Family Medicine | Admitting: Family Medicine

## 2021-06-01 DIAGNOSIS — Z780 Asymptomatic menopausal state: Secondary | ICD-10-CM | POA: Diagnosis not present

## 2021-06-01 DIAGNOSIS — M85851 Other specified disorders of bone density and structure, right thigh: Secondary | ICD-10-CM | POA: Diagnosis not present

## 2021-06-01 DIAGNOSIS — M8580 Other specified disorders of bone density and structure, unspecified site: Secondary | ICD-10-CM

## 2021-06-18 DIAGNOSIS — Z79891 Long term (current) use of opiate analgesic: Secondary | ICD-10-CM | POA: Diagnosis not present

## 2021-06-18 DIAGNOSIS — M5416 Radiculopathy, lumbar region: Secondary | ICD-10-CM | POA: Diagnosis not present

## 2021-06-18 DIAGNOSIS — Z5181 Encounter for therapeutic drug level monitoring: Secondary | ICD-10-CM | POA: Diagnosis not present

## 2021-06-18 DIAGNOSIS — G894 Chronic pain syndrome: Secondary | ICD-10-CM | POA: Diagnosis not present

## 2021-06-18 DIAGNOSIS — M5136 Other intervertebral disc degeneration, lumbar region: Secondary | ICD-10-CM | POA: Diagnosis not present

## 2021-06-28 ENCOUNTER — Telehealth (HOSPITAL_BASED_OUTPATIENT_CLINIC_OR_DEPARTMENT_OTHER): Payer: Self-pay | Admitting: *Deleted

## 2021-06-28 NOTE — Telephone Encounter (Signed)
° °  Pre-operative Risk Assessment    Patient Name: Christina Melendez  DOB: 20-Feb-1946 MRN: [id number]      Request for Surgical Clearance    Procedure:  LEFT TOTAL KNEE ARTHROPLASTY   Date of Surgery:  Clearance 08/13/21                                 Surgeon:  DR Gaynelle Arabian  Surgeon's Group or Practice Name:  [facility]  Phone number:  [phone] Fax number:  [phone] ATTN KELLY HANCOCK    Type of Clearance Requested:   - Medical  - Pharmacy:  Hold Aspirin     Type of Anesthesia:   CHOICE    Additional requests/questions:    SignedAlvina Filbert   06/28/2021, 2:23 PM

## 2021-06-28 NOTE — Telephone Encounter (Signed)
Pt has appt with Ronal Fear, [facility] 07/19/21. I have updated the appt notes to reflect pt needs pre op clearance. I will forward notes to [facility] for upcoming appt. Will send FYI to requesting office the pt has appt 07/19/21.

## 2021-06-28 NOTE — Telephone Encounter (Signed)
° °  Name: Christina Melendez  DOB: 10/14/1945  MRN: [id number]  Primary Cardiologist: [name], MD  Chart reviewed as part of pre-operative protocol coverage. Because of Hodgkins past medical history and time since last visit, she will require a follow-up visit in order to better assess preoperative cardiovascular risk.  Pre-op covering staff: - Please schedule appointment and call patient to inform them. If patient already had an upcoming appointment within acceptable timeframe, please add "pre-op clearance" to the appointment notes so provider is aware. - Please contact requesting surgeon's office via preferred method (i.e, phone, fax) to inform them of need for appointment prior to surgery.  If applicable, this message will also be routed to pharmacy pool and/or primary cardiologist for input on holding anticoagulant/antiplatelet agent as requested below so that this information is available to the clearing provider at time of patient's appointment.   [place], [place]  06/28/2021, 2:38 PM

## 2021-07-11 DIAGNOSIS — M25662 Stiffness of left knee, not elsewhere classified: Secondary | ICD-10-CM | POA: Diagnosis not present

## 2021-07-11 DIAGNOSIS — M25562 Pain in left knee: Secondary | ICD-10-CM | POA: Diagnosis not present

## 2021-07-11 DIAGNOSIS — M1712 Unilateral primary osteoarthritis, left knee: Secondary | ICD-10-CM | POA: Diagnosis not present
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
## 2021-07-19 ENCOUNTER — Other Ambulatory Visit: Payer: Self-pay

## 2021-07-19 ENCOUNTER — Ambulatory Visit: Payer: Medicare HMO | Admitting: Physician Assistant

## 2021-07-19 ENCOUNTER — Encounter: Payer: Self-pay | Admitting: Physician Assistant

## 2021-07-19 VITALS — BP 120/70 | HR 79 | Ht 64.0 in | Wt 179.4 lb

## 2021-07-19 DIAGNOSIS — I2510 Atherosclerotic heart disease of native coronary artery without angina pectoris: Secondary | ICD-10-CM

## 2021-07-19 DIAGNOSIS — I491 Atrial premature depolarization: Secondary | ICD-10-CM

## 2021-07-19 DIAGNOSIS — E782 Mixed hyperlipidemia: Secondary | ICD-10-CM | POA: Diagnosis not present

## 2021-07-19 DIAGNOSIS — R002 Palpitations: Secondary | ICD-10-CM | POA: Diagnosis not present

## 2021-07-19 NOTE — Patient Instructions (Signed)
Medication Instructions:  No Changes *If you need a refill on your cardiac medications before your next appointment, please call your pharmacy*   Lab Work: No Labs If you have labs (blood work) drawn today and your tests are completely normal, you will receive your results only by: [place] (if you have MyChart) OR A paper copy in the mail If you have any lab test that is abnormal or we need to change your treatment, we will call you to review the results.   Testing/Procedures: No Testing   Follow-Up: At [facility], you and your health needs are our priority.  As part of our continuing mission to provide you with exceptional heart care, we have created designated Provider Care Teams.  These Care Teams include your primary Cardiologist (physician) and Advanced Practice Providers (APPs -  Physician Assistants and Nurse Practitioners) who all work together to provide you with the care you need, when you need it.  We recommend signing up for the patient portal called "MyChart".  Sign up information is provided on this After Visit Summary.  MyChart is used to connect with patients for Virtual Visits (Telemedicine).  Patients are able to view lab/test results, encounter notes, upcoming appointments, etc.  Non-urgent messages can be sent to your provider as well.   To learn more about what you can do with MyChart, go to [email or web address].    Your next appointment:   1 year(s)  The format for your next appointment:   In Person  Provider:   [name], MD     Other Instructions Hold Aspirin 1 week prior to Surgery.

## 2021-07-23 NOTE — Progress Notes (Signed)
Surgery orders requested via Epic inbox. °

## 2021-08-01 ENCOUNTER — Encounter (HOSPITAL_COMMUNITY): Payer: Medicare HMO

## 2021-08-03 DIAGNOSIS — M170 Bilateral primary osteoarthritis of knee: Secondary | ICD-10-CM | POA: Diagnosis not present

## 2021-08-13 ENCOUNTER — Ambulatory Visit: Admit: 2021-08-13 | Payer: Medicare HMO | Admitting: Orthopedic Surgery

## 2021-08-13 DIAGNOSIS — M1712 Unilateral primary osteoarthritis, left knee: Secondary | ICD-10-CM

## 2021-08-13 SURGERY — ARTHROPLASTY, KNEE, TOTAL
Anesthesia: Choice | Site: Knee | Laterality: Left

## 2021-08-22 ENCOUNTER — Telehealth: Payer: Self-pay | Admitting: Cardiovascular Disease

## 2021-08-22 NOTE — Telephone Encounter (Signed)
? ?  Pre-operative Risk Assessment  ?  ?Patient Name: Christina Melendez  ?DOB: December 13, 1945 ?MRN: [id number]  ? ?  ? ?Request for Surgical Clearance   ? ?Procedure:   left total knee arthroplasty ? ?Date of Surgery:  Clearance 01/07/22                              ?   ?Surgeon:  Dr. Hector Shade ?Surgeon's Group or Practice Name:  [facility] ?Phone number:  [phone] ?Fax number:  [phone] Attn: Glendale Chard ?  ?Type of Clearance Requested:   ?- Medical  ?- Pharmacy:  Hold Aspirin - unspecified  ?  ?Type of Anesthesia:   Choice ?  ?Additional requests/questions:   n/a ? ?Signed, ?Sheral Apley M   ?08/22/2021, 3:35 PM  ? ?

## 2021-08-23 NOTE — Telephone Encounter (Signed)
Preoperative team, patient was cleared for this procedure on 07/19/2021.  Please resend Caron Presume, PA-C's note to requesting office.  Thank you for your help. ? ?Jossie Ng. Antony Sian NP-C ? ?  ?08/23/2021, 11:56 AM ?[place] ?[address] ?Office [phone] Fax [phone] ? ?

## 2021-08-23 NOTE — Telephone Encounter (Signed)
NOTES HAVE BEEN RE-FAXED; SEE NOTES FROM PRE OP PROVIDER Coletta Memos, FNP  ?

## 2021-08-27 DIAGNOSIS — G8929 Other chronic pain: Secondary | ICD-10-CM | POA: Diagnosis not present

## 2021-08-27 DIAGNOSIS — K219 Gastro-esophageal reflux disease without esophagitis: Secondary | ICD-10-CM | POA: Diagnosis not present

## 2021-08-27 DIAGNOSIS — E782 Mixed hyperlipidemia: Secondary | ICD-10-CM | POA: Diagnosis not present

## 2021-09-03 DIAGNOSIS — R5383 Other fatigue: Secondary | ICD-10-CM | POA: Diagnosis not present

## 2021-09-03 DIAGNOSIS — E559 Vitamin D deficiency, unspecified: Secondary | ICD-10-CM | POA: Diagnosis not present

## 2021-09-03 DIAGNOSIS — I700 Atherosclerosis of aorta: Secondary | ICD-10-CM | POA: Diagnosis not present

## 2021-09-03 DIAGNOSIS — R0602 Shortness of breath: Secondary | ICD-10-CM | POA: Diagnosis not present

## 2021-10-09 ENCOUNTER — Other Ambulatory Visit: Payer: Self-pay | Admitting: Physician Assistant

## 2021-10-16 DIAGNOSIS — F3342 Major depressive disorder, recurrent, in full remission: Secondary | ICD-10-CM | POA: Diagnosis not present

## 2021-10-16 DIAGNOSIS — G4700 Insomnia, unspecified: Secondary | ICD-10-CM | POA: Diagnosis not present

## 2021-10-16 DIAGNOSIS — I700 Atherosclerosis of aorta: Secondary | ICD-10-CM | POA: Diagnosis not present

## 2021-10-16 DIAGNOSIS — M810 Age-related osteoporosis without current pathological fracture: Secondary | ICD-10-CM | POA: Diagnosis not present

## 2021-10-21 DIAGNOSIS — G894 Chronic pain syndrome: Secondary | ICD-10-CM | POA: Diagnosis not present

## 2021-10-21 DIAGNOSIS — M5136 Other intervertebral disc degeneration, lumbar region: Secondary | ICD-10-CM | POA: Diagnosis not present
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
## 2021-11-01 DIAGNOSIS — Z01818 Encounter for other preprocedural examination: Secondary | ICD-10-CM | POA: Diagnosis not present

## 2021-11-01 DIAGNOSIS — I700 Atherosclerosis of aorta: Secondary | ICD-10-CM | POA: Diagnosis not present

## 2021-11-01 DIAGNOSIS — M1712 Unilateral primary osteoarthritis, left knee: Secondary | ICD-10-CM | POA: Diagnosis not present

## 2021-11-02 NOTE — Progress Notes (Addendum)
Anesthesia Review:  PCP: DR Dayna Ramus with [facility]- hadto see for clearance per pt  Called and requested LOV note.  Office to fax. 11/01/21 LOV with andy Brake,NP on chart with clearance in ov note.   Called and spoke with kelly at [facility] and requested clearance from CPP. Claiborne Billings stated she had not gotten from PCP yet.  To follow up with them and fax once received.  Clearance on chart dated 11/01/2021.   Cardiologist :  PT reports no cardiologist at preop but in epic states she was cleared for this surgery on 07/19/21.  07/19/21- LOV  Ronal Fear t,  PAc-  clearance in note Cardiology  Chest x-ray : EKG : 07/19/21  Monitor- 11/17/20  Ct cors- 2021  Ct chest- 2021  PFt- 03/2020  Echo : Stress test: Cardiac Cath :  Activity level: can do a flight of stairs without difficutly  Sleep Study/ CPAP : none  Fasting Blood Sugar :      / Checks Blood Sugar -- times a day:   Blood Thinner/ Instructions /Last Dose: ASA / Instructions/ Last Dose :   81 mg aspirin

## 2021-11-05 NOTE — Progress Notes (Signed)
DUE TO COVID-19 ONLY 2 VISITOR IS ALLOWED TO COME WITH YOU AND STAY IN THE WAITING ROOM ONLY DURING PRE OP AND PROCEDURE DAY OF SURGERY. 4  VISITOR  MAY VISIT WITH YOU AFTER SURGERY IN YOUR PRIVATE ROOM DURING VISITING HOURS ONLY! YOU MAY HAVE ONE PERSON SPEND THE NITE WITH YOU IN YOUR ROOM AFTER SURGERY.       Your procedure is scheduled on:      11/19/2021   Report to [facility] Main  Entrance   Report to admitting at       0845          AM DO NOT [place], PICTURE ID OR WALLET DAY OF SURGERY.      Call this number if you have problems the morning of surgery [phone]    REMEMBER: NO  SOLID FOODS , CANDY, GUM OR MINTS AFTER [employer] .       Marland Kitchen CLEAR LIQUIDS UNTIL     0830am            DAY OF SURGERY.      PLEASE FINISH ENSURE DRINK PER SURGEON ORDER  WHICH NEEDS TO BE COMPLETED AT    0830am       MORNING OF SURGERY.       CLEAR LIQUID DIET   Foods Allowed      WATER BLACK COFFEE ( SUGAR OK, NO MILK, CREAM OR CREAMER) REGULAR AND DECAF  TEA ( SUGAR OK NO MILK, CREAM, OR CREAMER) REGULAR AND DECAF  PLAIN JELLO ( NO RED)  FRUIT ICES ( NO RED, NO FRUIT PULP)  POPSICLES ( NO RED)  JUICE- APPLE, WHITE GRAPE AND WHITE CRANBERRY  SPORT DRINK LIKE GATORADE ( NO RED)  CLEAR BROTH ( VEGETABLE , CHICKEN OR BEEF)                                                                     BRUSH YOUR TEETH MORNING OF SURGERY AND RINSE YOUR MOUTH OUT, NO CHEWING GUM CANDY OR MINTS.     Take these medicines the morning of surgery with A SIP OF WATER:  nexium, metoprolol, prozac    DO NOT TAKE ANY DIABETIC MEDICATIONS DAY OF YOUR SURGERY                               You may not have any metal on your body including hair pins and              piercings  Do not wear jewelry, make-up, lotions, powders or perfumes, deodorant             Do not wear nail polish on your fingernails.              IF YOU ARE A FEMALE AND WANT TO SHAVE UNDER ARMS OR LEGS  PRIOR TO SURGERY YOU MUST DO SO AT LEAST 48 HOURS PRIOR TO SURGERY.              Men may shave face and neck.   Do not bring valuables to the hospital. [facility] IS NOT  RESPONSIBLE   FOR VALUABLES.  Contacts, dentures or bridgework may not be worn into surgery.  Leave suitcase in the car. After surgery it may be brought to your room.     Patients discharged the day of surgery will not be allowed to drive home. IF YOU ARE HAVING SURGERY AND GOING HOME THE SAME DAY, YOU MUST HAVE AN ADULT TO DRIVE YOU HOME AND BE WITH YOU FOR 24 HOURS. YOU MAY GO HOME BY TAXI OR UBER OR ORTHERWISE, BUT AN ADULT MUST ACCOMPANY YOU HOME AND STAY WITH YOU FOR 24 HOURS.                Please read over the following fact sheets you were given: _____________________________________________________________________  [facility] - Preparing for Surgery Before surgery, you can play an important role.  Because skin is not sterile, your skin needs to be as free of germs as possible.  You can reduce the number of germs on your skin by washing with CHG (chlorahexidine gluconate) soap before surgery.  CHG is an antiseptic cleaner which kills germs and bonds with the skin to continue killing germs even after washing. Please DO NOT use if you have an allergy to CHG or antibacterial soaps.  If your skin becomes reddened/irritated stop using the CHG and inform your nurse when you arrive at Short Stay. Do not shave (including legs and underarms) for at least 48 hours prior to the first CHG shower.  You may shave your face/neck. Please follow these instructions carefully:  1.  Shower with CHG Soap the night before surgery and the  morning of Surgery.  2.  If you choose to wash your hair, wash your hair first as usual with your  normal  shampoo.  3.  After you shampoo, rinse your hair and body thoroughly to remove the  shampoo.                           4.  Use CHG as you would any other liquid soap.  You can apply chg  directly  to the skin and wash                       Gently with a scrungie or clean washcloth.  5.  Apply the CHG Soap to your body ONLY FROM THE NECK DOWN.   Do not use on face/ open                           Wound or open sores. Avoid contact with eyes, ears mouth and genitals (private parts).                       Wash face,  Genitals (private parts) with your normal soap.             6.  Wash thoroughly, paying special attention to the area where your surgery  will be performed.  7.  Thoroughly rinse your body with warm water from the neck down.  8.  DO NOT shower/wash with your normal soap after using and rinsing off  the CHG Soap.                9.  Pat yourself dry with a clean towel.            10.  Wear clean pajamas.  11.  Place clean sheets on your bed the night of your first shower and do not  sleep with pets. Day of Surgery : Do not apply any lotions/deodorants the morning of surgery.  Please wear clean clothes to the hospital/surgery center.  FAILURE TO FOLLOW THESE INSTRUCTIONS MAY RESULT IN THE CANCELLATION OF YOUR SURGERY PATIENT SIGNATURE_________________________________  NURSE SIGNATURE__________________________________  ________________________________________________________________________

## 2021-11-06 ENCOUNTER — Encounter (HOSPITAL_COMMUNITY)
Admission: RE | Admit: 2021-11-06 | Discharge: 2021-11-06 | Disposition: A | Discharge status: Home / Self Care | Payer: Medicare HMO | Source: Ambulatory Visit | Attending: Orthopedic Surgery | Admitting: Orthopedic Surgery

## 2021-11-06 ENCOUNTER — Encounter (HOSPITAL_COMMUNITY): Payer: Self-pay

## 2021-11-06 ENCOUNTER — Other Ambulatory Visit: Payer: Self-pay

## 2021-11-06 VITALS — BP 109/61 | HR 76 | Temp 97.7°F | Resp 16 | Ht 64.0 in | Wt 172.0 lb

## 2021-11-06 DIAGNOSIS — Z01812 Encounter for preprocedural laboratory examination: Secondary | ICD-10-CM | POA: Insufficient documentation

## 2021-11-06 DIAGNOSIS — M1712 Unilateral primary osteoarthritis, left knee: Secondary | ICD-10-CM | POA: Diagnosis not present

## 2021-11-06 DIAGNOSIS — Z01818 Encounter for other preprocedural examination: Secondary | ICD-10-CM

## 2021-11-06 DIAGNOSIS — Z79899 Other long term (current) drug therapy: Secondary | ICD-10-CM | POA: Insufficient documentation

## 2021-11-06 HISTORY — DX: Palpitations: R00.2

## 2021-11-06 LAB — CBC
HCT: 40 % (ref 36.0–46.0)
Hemoglobin: 12.7 g/dL (ref 12.0–15.0)
MCH: 27.4 pg (ref 26.0–34.0)
MCHC: 31.8 g/dL (ref 30.0–36.0)
MCV: 86.4 fL (ref 80.0–100.0)
Platelets: 296 10*3/uL (ref 150–400)
RBC: 4.63 MIL/uL (ref 3.87–5.11)
RDW: 13.4 % (ref 11.5–15.5)
WBC: 9 10*3/uL (ref 4.0–10.5)
nRBC: 0 % (ref 0.0–0.2)

## 2021-11-06 LAB — BASIC METABOLIC PANEL
Anion gap: 6 (ref 5–15)
BUN: 17 mg/dL (ref 8–23)
CO2: 25 mmol/L (ref 22–32)
Calcium: 10.4 mg/dL — ABNORMAL HIGH (ref 8.9–10.3)
Chloride: 107 mmol/L (ref 98–111)
Creatinine, Ser: 0.7 mg/dL (ref 0.44–1.00)
GFR, Estimated: >60 mL/min (ref >60)
Glucose, Bld: 124 mg/dL — ABNORMAL HIGH (ref 70–99)
Potassium: 4.2 mmol/L (ref 3.5–5.1)
Sodium: 138 mmol/L (ref 135–145)

## 2021-11-06 LAB — SURGICAL PCR SCREEN
MRSA, PCR: NEGATIVE
Staphylococcus aureus: NEGATIVE
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
## 2021-11-08 NOTE — Anesthesia Preprocedure Evaluation (Addendum)
Anesthesia Evaluation  Patient identified by MRN, date of birth, ID band Patient awake    Reviewed: Allergy & Precautions, NPO status , Patient's Chart, lab work & pertinent test results  History of Anesthesia Complications (+) PONV and history of anesthetic complications (pt very unsure )  Airway Mallampati: IV  TM Distance: >3 FB Neck ROM: Full    Dental  (+) Teeth Intact, Dental Advisory Given   Pulmonary  Snores at night, has never sleep study    Pulmonary exam normal breath sounds clear to auscultation       Cardiovascular + DOE  Normal cardiovascular exam Rhythm:Regular Rate:Normal  Echo 01/19/20:  1. Mild intracavitary gradient. Peak velocity 0.83 m/s. Peak gradient 2.7 mmHg. GLS tracing is inaccurate due to poor tracking. Left ventricular ejection fraction, by estimation, is 60 to 65%. The left ventricle has normal function. The left ventricle has no regional wall motion abnormalities. There is moderate concentric left ventricular hypertrophy. Left ventricular diastolic parameters are consistent with Grade I diastolic dysfunction (impaired relaxation). Elevated left ventricular end-diastolic  pressure.  2. Right ventricular systolic function is normal. The right ventricular size is normal. There is normal pulmonary artery systolic pressure.  3. The mitral valve is normal in structure. Trivial mitral valve regurgitation. No evidence of mitral stenosis.  4. The aortic valve is tricuspid. Aortic valve regurgitation is not visualized. No aortic stenosis is present.  5. The inferior vena cava is normal in size with greater than 50% respiratory variability, suggesting right atrial pressure of 3 mmHg.    CT coronary morphology 12/30/19:  1. Coronary calcium score of 27. This was 45th percentile for age and sex matched control. This calcium score was obtained from the 12/15/2019 scan as the Calcium scoring on this study was  omitted. 2. Normal coronary origin with right dominance. 3. Mild atherosclerosis. CAD RAD 2. 4. Consider non-atherosclerotic causes of chest pain. 5. The ostial RCA is not adequately visualized in any phase due to significant motion artifact to determine if any non-calcified plaque is present. Consider nuclear stress testing if clinically indicated. 5. Consider preventive therapy and risk factor modification.   Neuro/Psych PSYCHIATRIC DISORDERS Anxiety Depression negative neurological ROS     GI/Hepatic Neg liver ROS, GERD  Medicated and Controlled,  Endo/Other  negative endocrine ROS  Renal/GU negative Renal ROS  negative genitourinary   Musculoskeletal  (+) Arthritis , Osteoarthritis,  Hb 12.7, plt 296   Abdominal   Peds negative pediatric ROS (+)  Hematology negative hematology ROS (+)   Anesthesia Other Findings   Reproductive/Obstetrics negative OB ROS                            Anesthesia Physical Anesthesia Plan  ASA: 3  Anesthesia Plan: Spinal, Regional and MAC   Post-op Pain Management: Regional block* and Ofirmev IV (intra-op)*   Induction:   PONV Risk Score and Plan: 2 and Propofol infusion and TIVA  Airway Management Planned: Natural Airway and Nasal Cannula  Additional Equipment: None  Intra-op Plan:   Post-operative Plan:   Informed Consent: I have reviewed the patients History and Physical, chart, labs and discussed the procedure including the risks, benefits and alternatives for the proposed anesthesia with the patient or authorized representative who has indicated his/her understanding and acceptance.       Plan Discussed with: CRNA  Anesthesia Plan Comments:        Anesthesia Quick Evaluation

## 2021-11-19 ENCOUNTER — Other Ambulatory Visit: Payer: Self-pay

## 2021-11-19 ENCOUNTER — Observation Stay (HOSPITAL_COMMUNITY)
Admission: RE | Admit: 2021-11-19 | Discharge: 2021-11-20 | Disposition: A | Discharge status: Home / Self Care | Payer: Medicare HMO | Source: Ambulatory Visit | Attending: Orthopedic Surgery | Admitting: Orthopedic Surgery

## 2021-11-19 ENCOUNTER — Encounter (HOSPITAL_COMMUNITY)
Admission: RE | Disposition: A | Discharge status: Home / Self Care | Payer: Self-pay | Source: Ambulatory Visit | Attending: Orthopedic Surgery

## 2021-11-19 ENCOUNTER — Ambulatory Visit (HOSPITAL_COMMUNITY): Payer: Medicare HMO | Admitting: Emergency Medicine

## 2021-11-19 ENCOUNTER — Encounter (HOSPITAL_COMMUNITY): Payer: Self-pay | Admitting: Orthopedic Surgery

## 2021-11-19 ENCOUNTER — Ambulatory Visit (HOSPITAL_BASED_OUTPATIENT_CLINIC_OR_DEPARTMENT_OTHER): Payer: Medicare HMO | Admitting: Registered Nurse

## 2021-11-19 DIAGNOSIS — M1712 Unilateral primary osteoarthritis, left knee: Secondary | ICD-10-CM

## 2021-11-19 DIAGNOSIS — Z01818 Encounter for other preprocedural examination: Secondary | ICD-10-CM

## 2021-11-19 DIAGNOSIS — G8918 Other acute postprocedural pain: Secondary | ICD-10-CM | POA: Diagnosis not present

## 2021-11-19 DIAGNOSIS — Z79899 Other long term (current) drug therapy: Secondary | ICD-10-CM | POA: Diagnosis not present

## 2021-11-19 DIAGNOSIS — I10 Essential (primary) hypertension: Secondary | ICD-10-CM | POA: Diagnosis not present

## 2021-11-19 DIAGNOSIS — Z7982 Long term (current) use of aspirin: Secondary | ICD-10-CM | POA: Insufficient documentation

## 2021-11-19 DIAGNOSIS — F418 Other specified anxiety disorders: Secondary | ICD-10-CM

## 2021-11-19 DIAGNOSIS — R0609 Other forms of dyspnea: Secondary | ICD-10-CM | POA: Diagnosis not present

## 2021-11-19 DIAGNOSIS — M179 Osteoarthritis of knee, unspecified: Secondary | ICD-10-CM

## 2021-11-19 HISTORY — PX: TOTAL KNEE ARTHROPLASTY: SHX125

## 2021-11-19 SURGERY — ARTHROPLASTY, KNEE, TOTAL
Anesthesia: Monitor Anesthesia Care | Site: Knee | Laterality: Left

## 2021-11-19 MED ORDER — CEFAZOLIN SODIUM-DEXTROSE 2-4 GM/100ML-% IV SOLN
2.0000 g | Freq: Four times a day (QID) | INTRAVENOUS | Status: AC
  Administered 2021-11-19 – 2021-11-20 (×2): 2 g via INTRAVENOUS
  Filled 2021-11-19 (×2): qty 100

## 2021-11-19 MED ORDER — ORAL CARE MOUTH RINSE: 15.0000 mL | OROMUCOSAL | Status: DC | PRN

## 2021-11-19 MED ORDER — SODIUM CHLORIDE (PF) 0.9 % IJ SOLN
INTRAMUSCULAR | Status: AC
  Filled 2021-11-19: qty 50

## 2021-11-19 MED ORDER — TEMAZEPAM 30 MG PO CAPS
30.0000 mg | ORAL_CAPSULE | Freq: Every day | ORAL | Status: DC
  Administered 2021-11-19: 30 mg via ORAL
  Filled 2021-11-19: qty 1

## 2021-11-19 MED ORDER — ACETAMINOPHEN 10 MG/ML IV SOLN
1000.0000 mg | INTRAVENOUS | Status: AC
  Administered 2021-11-19: 1000 mg via INTRAVENOUS
  Filled 2021-11-19: qty 100

## 2021-11-19 MED ORDER — SODIUM CHLORIDE 0.9 % IR SOLN
Status: DC | PRN
  Administered 2021-11-19: 1000 mL

## 2021-11-19 MED ORDER — FENTANYL CITRATE PF 50 MCG/ML IJ SOSY
PREFILLED_SYRINGE | INTRAMUSCULAR | Status: AC
  Administered 2021-11-19: 50 ug via INTRAVENOUS
  Filled 2021-11-19: qty 2

## 2021-11-19 MED ORDER — ROPIVACAINE HCL 5 MG/ML IJ SOLN
INTRAMUSCULAR | Status: DC | PRN
  Administered 2021-11-19: 30 mL via PERINEURAL

## 2021-11-19 MED ORDER — DEXAMETHASONE SODIUM PHOSPHATE 10 MG/ML IJ SOLN
10.0000 mg | Freq: Once | INTRAMUSCULAR | Status: AC
Start: 2021-11-20 — End: 2021-11-20
  Administered 2021-11-20: 10 mg via INTRAVENOUS
  Filled 2021-11-19: qty 1

## 2021-11-19 MED ORDER — SODIUM CHLORIDE (PF) 0.9 % IJ SOLN
INTRAMUSCULAR | Status: DC | PRN
  Administered 2021-11-19: 60 mL

## 2021-11-19 MED ORDER — SODIUM CHLORIDE 0.9 % IV SOLN: INTRAVENOUS | Status: DC

## 2021-11-19 MED ORDER — METHOCARBAMOL 500 MG IVPB - SIMPLE MED: 500.0000 mg | Freq: Four times a day (QID) | INTRAVENOUS | Status: DC | PRN

## 2021-11-19 MED ORDER — PANTOPRAZOLE SODIUM 40 MG PO TBEC
40.0000 mg | DELAYED_RELEASE_TABLET | Freq: Every day | ORAL | Status: DC
  Administered 2021-11-20: 40 mg via ORAL
  Filled 2021-11-19: qty 1

## 2021-11-19 MED ORDER — DOCUSATE SODIUM 100 MG PO CAPS
100.0000 mg | ORAL_CAPSULE | Freq: Two times a day (BID) | ORAL | Status: DC
  Administered 2021-11-19 – 2021-11-20 (×2): 100 mg via ORAL
  Filled 2021-11-19 (×2): qty 1

## 2021-11-19 MED ORDER — MORPHINE SULFATE (PF) 2 MG/ML IV SOLN
1.0000 mg | INTRAVENOUS | Status: DC | PRN
  Administered 2021-11-20: 2 mg via INTRAVENOUS
  Filled 2021-11-19: qty 1

## 2021-11-19 MED ORDER — FLEET ENEMA 7-19 GM/118ML RE ENEM: 1.0000 | ENEMA | Freq: Once | RECTAL | Status: DC | PRN

## 2021-11-19 MED ORDER — CHLORHEXIDINE GLUCONATE 0.12 % MT SOLN
15.0000 mL | Freq: Once | OROMUCOSAL | Status: AC
  Administered 2021-11-19: 15 mL via OROMUCOSAL

## 2021-11-19 MED ORDER — TRANEXAMIC ACID-NACL 1000-0.7 MG/100ML-% IV SOLN
1000.0000 mg | INTRAVENOUS | Status: AC
  Administered 2021-11-19: 1000 mg via INTRAVENOUS
  Filled 2021-11-19: qty 100

## 2021-11-19 MED ORDER — POVIDONE-IODINE 10 % EX SWAB
2.0000 | Freq: Once | CUTANEOUS | Status: AC
  Administered 2021-11-19: 2 via TOPICAL

## 2021-11-19 MED ORDER — DIPHENHYDRAMINE HCL 12.5 MG/5ML PO ELIX: 12.5000 mg | ORAL_SOLUTION | ORAL | Status: DC | PRN

## 2021-11-19 MED ORDER — SODIUM CHLORIDE (PF) 0.9 % IJ SOLN
INTRAMUSCULAR | Status: AC
  Filled 2021-11-19: qty 10

## 2021-11-19 MED ORDER — PROPOFOL 10 MG/ML IV BOLUS
INTRAVENOUS | Status: DC | PRN
  Administered 2021-11-19: 20 mg via INTRAVENOUS
  Administered 2021-11-19: 40 ug/kg/min via INTRAVENOUS

## 2021-11-19 MED ORDER — ACETAMINOPHEN 500 MG PO TABS
1000.0000 mg | ORAL_TABLET | Freq: Four times a day (QID) | ORAL | Status: DC
  Administered 2021-11-19 – 2021-11-20 (×3): 1000 mg via ORAL
  Filled 2021-11-19 (×3): qty 2

## 2021-11-19 MED ORDER — METOPROLOL TARTRATE 50 MG PO TABS
50.0000 mg | ORAL_TABLET | Freq: Two times a day (BID) | ORAL | Status: DC
  Administered 2021-11-20: 50 mg via ORAL
  Filled 2021-11-19: qty 1

## 2021-11-19 MED ORDER — METHOCARBAMOL 500 MG PO TABS
500.0000 mg | ORAL_TABLET | Freq: Four times a day (QID) | ORAL | Status: DC | PRN
  Administered 2021-11-19 – 2021-11-20 (×2): 500 mg via ORAL
  Filled 2021-11-19 (×2): qty 1

## 2021-11-19 MED ORDER — ORAL CARE MOUTH RINSE: 15.0000 mL | Freq: Once | OROMUCOSAL | Status: AC

## 2021-11-19 MED ORDER — PHENOL 1.4 % MT LIQD
1.0000 | OROMUCOSAL | Status: DC | PRN
Start: 2021-11-19 — End: 2021-11-20

## 2021-11-19 MED ORDER — LACTATED RINGERS IV SOLN: INTRAVENOUS | Status: DC

## 2021-11-19 MED ORDER — DEXAMETHASONE SODIUM PHOSPHATE 10 MG/ML IJ SOLN
INTRAMUSCULAR | Status: DC | PRN
  Administered 2021-11-19: 10 mg

## 2021-11-19 MED ORDER — PHENYLEPHRINE HCL-NACL 20-0.9 MG/250ML-% IV SOLN
INTRAVENOUS | Status: DC | PRN
  Administered 2021-11-19: 25 ug/min via INTRAVENOUS

## 2021-11-19 MED ORDER — FENTANYL CITRATE PF 50 MCG/ML IJ SOSY: 50.0000 ug | PREFILLED_SYRINGE | INTRAMUSCULAR | Status: DC

## 2021-11-19 MED ORDER — ONDANSETRON HCL 4 MG/2ML IJ SOLN: 4.0000 mg | Freq: Four times a day (QID) | INTRAMUSCULAR | Status: DC | PRN

## 2021-11-19 MED ORDER — ONDANSETRON HCL 4 MG PO TABS: 4.0000 mg | ORAL_TABLET | Freq: Four times a day (QID) | ORAL | Status: DC | PRN

## 2021-11-19 MED ORDER — ONDANSETRON HCL 4 MG/2ML IJ SOLN
INTRAMUSCULAR | Status: AC
  Filled 2021-11-19: qty 2

## 2021-11-19 MED ORDER — DEXAMETHASONE SODIUM PHOSPHATE 10 MG/ML IJ SOLN
INTRAMUSCULAR | Status: AC
  Filled 2021-11-19: qty 1

## 2021-11-19 MED ORDER — PHENYLEPHRINE HCL-NACL 20-0.9 MG/250ML-% IV SOLN
INTRAVENOUS | Status: AC
  Filled 2021-11-19: qty 250

## 2021-11-19 MED ORDER — PROPOFOL 1000 MG/100ML IV EMUL
INTRAVENOUS | Status: AC
  Filled 2021-11-19: qty 100

## 2021-11-19 MED ORDER — BUPIVACAINE IN DEXTROSE 0.75-8.25 % IT SOLN
INTRATHECAL | Status: DC | PRN
  Administered 2021-11-19: 1.6 mL via INTRATHECAL

## 2021-11-19 MED ORDER — METOCLOPRAMIDE HCL 5 MG/ML IJ SOLN: 5.0000 mg | Freq: Three times a day (TID) | INTRAMUSCULAR | Status: DC | PRN

## 2021-11-19 MED ORDER — BUPIVACAINE LIPOSOME 1.3 % IJ SUSP
INTRAMUSCULAR | Status: AC
  Filled 2021-11-19: qty 20

## 2021-11-19 MED ORDER — MECLIZINE HCL 25 MG PO TABS
25.0000 mg | ORAL_TABLET | Freq: Every day | ORAL | Status: DC
  Administered 2021-11-20: 25 mg via ORAL
  Filled 2021-11-19: qty 1

## 2021-11-19 MED ORDER — MENTHOL 3 MG MT LOZG: 1.0000 | LOZENGE | OROMUCOSAL | Status: DC | PRN

## 2021-11-19 MED ORDER — METOCLOPRAMIDE HCL 5 MG PO TABS: 5.0000 mg | ORAL_TABLET | Freq: Three times a day (TID) | ORAL | Status: DC | PRN

## 2021-11-19 MED ORDER — BUPIVACAINE LIPOSOME 1.3 % IJ SUSP
INTRAMUSCULAR | Status: DC | PRN
  Administered 2021-11-19: 20 mL

## 2021-11-19 MED ORDER — BUPIVACAINE LIPOSOME 1.3 % IJ SUSP: 20.0000 mL | Freq: Once | INTRAMUSCULAR | Status: AC

## 2021-11-19 MED ORDER — DEXAMETHASONE SODIUM PHOSPHATE 10 MG/ML IJ SOLN
8.0000 mg | Freq: Once | INTRAMUSCULAR | Status: AC
  Administered 2021-11-19: 8 mg via INTRAVENOUS

## 2021-11-19 MED ORDER — ASPIRIN 325 MG PO TBEC
325.0000 mg | DELAYED_RELEASE_TABLET | Freq: Two times a day (BID) | ORAL | Status: DC
  Administered 2021-11-20: 325 mg via ORAL
  Filled 2021-11-19: qty 1

## 2021-11-19 MED ORDER — OXYCODONE HCL 5 MG PO TABS
10.0000 mg | ORAL_TABLET | ORAL | Status: DC | PRN
  Administered 2021-11-19 – 2021-11-20 (×3): 10 mg via ORAL
  Filled 2021-11-19 (×3): qty 2

## 2021-11-19 MED ORDER — OXYCODONE HCL 5 MG PO TABS: 5.0000 mg | ORAL_TABLET | ORAL | Status: DC | PRN

## 2021-11-19 MED ORDER — POLYETHYLENE GLYCOL 3350 17 G PO PACK: 17.0000 g | PACK | Freq: Every day | ORAL | Status: DC | PRN

## 2021-11-19 MED ORDER — ONDANSETRON HCL 4 MG/2ML IJ SOLN
INTRAMUSCULAR | Status: DC | PRN
  Administered 2021-11-19: 4 mg via INTRAVENOUS

## 2021-11-19 MED ORDER — CEFAZOLIN SODIUM-DEXTROSE 2-4 GM/100ML-% IV SOLN
2.0000 g | INTRAVENOUS | Status: AC
  Administered 2021-11-19: 2 g via INTRAVENOUS
  Filled 2021-11-19: qty 100

## 2021-11-19 MED ORDER — FLUOXETINE HCL 20 MG PO CAPS
20.0000 mg | ORAL_CAPSULE | Freq: Every day | ORAL | Status: DC
  Administered 2021-11-20: 20 mg via ORAL
  Filled 2021-11-19: qty 1

## 2021-11-19 MED ORDER — BISACODYL 10 MG RE SUPP: 10.0000 mg | Freq: Every day | RECTAL | Status: DC | PRN

## 2021-11-19 MED ORDER — STERILE WATER FOR IRRIGATION IR SOLN
Status: DC | PRN
  Administered 2021-11-19: 2000 mL

## 2021-11-19 SURGICAL SUPPLY — 60 items
ATTUNE PS FEM LT SZ 5 CEM KNEE (Femur) ×1 IMPLANT
ATTUNE PSRP INSE SZ5 7 KNEE (Insert) ×1 IMPLANT
BAG COUNTER SPONGE SURGICOUNT (BAG) ×1 IMPLANT
BAG SPEC THK2 15X12 ZIP CLS (MISCELLANEOUS) ×1
BAG SPNG CNTER NS LX DISP (BAG) ×1
BAG ZIPLOCK 12X15 (MISCELLANEOUS) ×2 IMPLANT
BASEPLATE TIBIAL ROTATING SZ 4 (Knees) ×1 IMPLANT
BLADE SAG 18X100X1.27 (BLADE) ×2 IMPLANT
BLADE SAW SGTL 11.0X1.19X90.0M (BLADE) ×2 IMPLANT
BNDG ELASTIC 6X5.8 VLCR STR LF (GAUZE/BANDAGES/DRESSINGS) ×2 IMPLANT
BOWL SMART MIX CTS (DISPOSABLE) ×2 IMPLANT
BSPLAT TIB 4 CMNT ROT PLAT STR (Knees) ×1 IMPLANT
CEMENT HV SMART SET (Cement) ×4 IMPLANT
CLSR STERI-STRIP ANTIMIC 1/2X4 (GAUZE/BANDAGES/DRESSINGS) ×1 IMPLANT
COVER SURGICAL LIGHT HANDLE (MISCELLANEOUS) ×2 IMPLANT
CUFF TOURN SGL QUICK 34 (TOURNIQUET CUFF) ×2
CUFF TRNQT CYL 34X4.125X (TOURNIQUET CUFF) ×1 IMPLANT
DRAPE INCISE IOBAN 66X45 STRL (DRAPES) ×2 IMPLANT
DRAPE U-SHAPE 47X51 STRL (DRAPES) ×2 IMPLANT
DRSG AQUACEL AG ADV 3.5X10 (GAUZE/BANDAGES/DRESSINGS) ×2 IMPLANT
DURAPREP 26ML APPLICATOR (WOUND CARE) ×2 IMPLANT
ELECT REM PT RETURN 15FT ADLT (MISCELLANEOUS) ×2 IMPLANT
GLOVE BIO SURGEON STRL SZ 6.5 (GLOVE) IMPLANT
GLOVE BIO SURGEON STRL SZ7.5 (GLOVE) IMPLANT
GLOVE BIO SURGEON STRL SZ8 (GLOVE) ×3 IMPLANT
GLOVE BIOGEL PI IND STRL 6.5 (GLOVE) IMPLANT
GLOVE BIOGEL PI IND STRL 7.0 (GLOVE) IMPLANT
GLOVE BIOGEL PI IND STRL 8 (GLOVE) ×1 IMPLANT
GLOVE BIOGEL PI INDICATOR 6.5 (GLOVE)
GLOVE BIOGEL PI INDICATOR 7.0 (GLOVE)
GLOVE BIOGEL PI INDICATOR 8 (GLOVE) ×2
GOWN STRL REUS W/ TWL LRG LVL3 (GOWN DISPOSABLE) ×1 IMPLANT
GOWN STRL REUS W/ TWL XL LVL3 (GOWN DISPOSABLE) IMPLANT
GOWN STRL REUS W/TWL LRG LVL3 (GOWN DISPOSABLE) ×2
GOWN STRL REUS W/TWL XL LVL3 (GOWN DISPOSABLE) ×6
HANDPIECE INTERPULSE COAX TIP (DISPOSABLE) ×2
HOLDER FOLEY CATH W/STRAP (MISCELLANEOUS) ×1 IMPLANT
IMMOBILIZER KNEE 20 (SOFTGOODS) ×2
IMMOBILIZER KNEE 20 THIGH 36 (SOFTGOODS) ×1 IMPLANT
KIT TURNOVER KIT A (KITS) ×1 IMPLANT
MANIFOLD NEPTUNE II (INSTRUMENTS) ×2 IMPLANT
NS IRRIG 1000ML POUR BTL (IV SOLUTION) ×2 IMPLANT
PACK TOTAL KNEE CUSTOM (KITS) ×2 IMPLANT
PADDING CAST COTTON 6X4 STRL (CAST SUPPLIES) ×4 IMPLANT
PADDING CAST SYN 6 (CAST SUPPLIES) ×1
PADDING CAST SYNTHETIC 6X4 NS (CAST SUPPLIES) IMPLANT
PATELLA MEDIAL ATTUN 35MM KNEE (Knees) ×1 IMPLANT
PROTECTOR NERVE ULNAR (MISCELLANEOUS) ×2 IMPLANT
SET HNDPC FAN SPRY TIP SCT (DISPOSABLE) ×1 IMPLANT
SPIKE FLUID TRANSFER (MISCELLANEOUS) ×2 IMPLANT
STRIP CLOSURE SKIN 1/2X4 (GAUZE/BANDAGES/DRESSINGS) ×4 IMPLANT
SUT MNCRL AB 4-0 PS2 18 (SUTURE) ×2 IMPLANT
SUT STRATAFIX 0 PDS 27 VIOLET (SUTURE) ×2
SUT VIC AB 2-0 CT1 27 (SUTURE) ×6
SUT VIC AB 2-0 CT1 TAPERPNT 27 (SUTURE) ×3 IMPLANT
SUTURE STRATFX 0 PDS 27 VIOLET (SUTURE) ×1 IMPLANT
TRAY FOLEY MTR SLVR 16FR STAT (SET/KITS/TRAYS/PACK) ×2 IMPLANT
TUBE SUCTION HIGH CAP CLEAR NV (SUCTIONS) ×2 IMPLANT
WATER STERILE IRR 1000ML POUR (IV SOLUTION) ×4 IMPLANT
WRAP KNEE MAXI GEL POST OP (GAUZE/BANDAGES/DRESSINGS) ×2 IMPLANT

## 2021-11-19 NOTE — Interval H&P Note (Signed)
History and Physical Interval Note:  11/19/2021 9:22 AM  Christina Melendez  has presented today for surgery, with the diagnosis of left knee osteoarthritis.  The various methods of treatment have been discussed with the patient and family. After consideration of risks, benefits and other options for treatment, the patient has consented to  Procedure(s): TOTAL KNEE ARTHROPLASTY (Left) as a surgical intervention.  The patient's history has been reviewed, patient examined, no change in status, stable for surgery.  I have reviewed the patient's chart and labs.  Questions were answered to the patient's satisfaction.     [name]

## 2021-11-19 NOTE — Anesthesia Procedure Notes (Signed)
Spinal  Start time: 11/19/2021 11:09 AM End time: 11/19/2021 11:12 AM Reason for block: surgical anesthesia Staffing Performed: resident/CRNA  Resident/CRNA: Victoriano Lain, CRNA Performed by: Victoriano Lain, CRNA Authorized by: Pervis Hocking, DO   Preanesthetic Checklist Completed: patient identified, IV checked, site marked, risks and benefits discussed, surgical consent, monitors and equipment checked, pre-op evaluation and timeout performed Spinal Block Patient position: sitting Prep: DuraPrep and site prepped and draped Patient monitoring: cardiac monitor, continuous pulse ox and blood pressure Approach: midline Location: L3-4 Injection technique: single-shot Needle Needle type: Pencan  Needle gauge: 24 G Needle length: 10 cm Assessment Sensory level: T4 Events: CSF return Additional Notes Pt placed in sitting position after monitors placed. Spinal kit date checked and verified. Sterile prep and drape of back. Local placed at the injection site. One attempt. + clear free flowing CSF. - heme. Pt tolerated procedure well.
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
## 2021-11-19 NOTE — Anesthesia Procedure Notes (Signed)
Anesthesia Regional Block: Adductor canal block   Pre-Anesthetic Checklist: , timeout performed,  Correct Patient, Correct Site, Correct Laterality,  Correct Procedure, Correct Position, site marked,  Risks and benefits discussed,  Surgical consent,  Pre-op evaluation,  At surgeon's request and post-op pain management  Laterality: Left  Prep: Maximum Sterile Barrier Precautions used, chloraprep       Needles:  Injection technique: Single-shot  Needle Type: Echogenic Stimulator Needle     Needle Length: 9cm  Needle Gauge: 22     Additional Needles:   Procedures:,,,, ultrasound used (permanent image in chart),,    Narrative:  Start time: 11/19/2021 9:50 AM End time: 11/19/2021 9:55 AM Injection made incrementally with aspirations every 5 mL.  Performed by: Personally  Anesthesiologist: Pervis Hocking, DO  Additional Notes: Monitors applied. No increased pain on injection. No increased resistance to injection. Injection made in 5cc increments. Good needle visualization. Patient tolerated procedure well.

## 2021-11-19 NOTE — Progress Notes (Signed)
AssistedDr. Beth Finucane with left, adductor canal block. Side rails up, monitors on throughout procedure. See vital signs in flow sheet. Tolerated Procedure well.  

## 2021-11-19 NOTE — Op Note (Signed)
OPERATIVE REPORT-TOTAL KNEE ARTHROPLASTY   Pre-operative diagnosis- Osteoarthritis  Left knee(s)  Post-operative diagnosis- Osteoarthritis Left knee(s)  Procedure-  Left  Total Knee Arthroplasty  Surgeon- Dione Plover. Rockney Grenz, MD  Assistant- Molli Barrows, PA-C   Anesthesia-   Adductor canal bock and spinal  EBL-50 mL   Drains None  Tourniquet time- 29 minutes @ 532 mm Hg  Complications- None  Condition-PACU - hemodynamically stable.   Brief Clinical Note  Christina Melendez is a 76 y.o. year old female with end stage OA of her left knee with progressively worsening pain and dysfunction. She has constant pain, with activity and at rest and significant functional deficits with difficulties even with ADLs. She has had extensive non-op management including analgesics, injections of cortisone and viscosupplements, and home exercise program, but remains in significant pain with significant dysfunction. Radiographs show bone on bone arthritis lateral and patellofemoral. She presents now for left Total Knee Arthroplasty.     Procedure in detail---   The patient is brought into the operating room and positioned supine on the operating table. After successful administration of  Adductor canal bock and spinal,   a tourniquet is placed high on the  Left thigh(s) and the lower extremity is prepped and draped in the usual sterile fashion. Time out is performed by the operating team and then the  Left lower extremity is wrapped in Esmarch, knee flexed and the tourniquet inflated to 300 mmHg.       A midline incision is made with a ten blade through the subcutaneous tissue to the level of the extensor mechanism. A fresh blade is used to make a medial parapatellar arthrotomy. Soft tissue over the proximal medial tibia is subperiosteally elevated to the joint line with a knife and into the semimembranosus bursa with a Cobb elevator. Soft tissue over the proximal lateral tibia is elevated with attention  being paid to avoiding the patellar tendon on the tibial tubercle. The patella is everted, knee flexed 90 degrees and the ACL and PCL are removed. Findings are bone on bone lateral and patellofemoral with significant patellar erosion             The drill is used to create a starting hole in the distal femur and the canal is thoroughly irrigated with sterile saline to remove the fatty contents. The 5 degree Left  valgus alignment guide is placed into the femoral canal and the distal femoral cutting block is pinned to remove 9 mm off the distal femur. Resection is made with an oscillating saw.      The tibia is subluxed forward and the menisci are removed. The extramedullary alignment guide is placed referencing proximally at the medial aspect of the tibial tubercle and distally along the second metatarsal axis and tibial crest. The block is pinned to remove 43m off the more deficient lateral  side. Resection is made with an oscillating saw. Size 4is the most appropriate size for the tibia and the proximal tibia is prepared with the modular drill and keel punch for that size.      The femoral sizing guide is placed and size 5 is most appropriate. Rotation is marked off the epicondylar axis and confirmed by creating a rectangular flexion gap at 90 degrees. The size 5 cutting block is pinned in this rotation and the anterior, posterior and chamfer cuts are made with the oscillating saw. The intercondylar block is then placed and that cut is made.      Trial size  4 tibial component, trial size 5 posterior stabilized femur and a 7  mm posterior stabilized rotating platform insert trial is placed. Full extension is achieved with excellent varus/valgus and anterior/posterior balance throughout full range of motion. The patella is everted and thickness measured to be 22  mm. Free hand resection is taken to 12 mm, a 35 template is placed, lug holes are drilled, trial patella is placed, and it tracks normally. Osteophytes  are removed off the posterior femur with the trial in place. All trials are removed and the cut bone surfaces prepared with pulsatile lavage. Cement is mixed and once ready for implantation, the size 4 tibial implant, size  5 posterior stabilized femoral component, and the size 35 patella are cemented in place and the patella is held with the clamp. The trial insert is placed and the knee held in full extension. The Exparel (20 ml mixed with 60 ml saline) is injected into the extensor mechanism, posterior capsule, medial and lateral gutters and subcutaneous tissues.  All extruded cement is removed and once the cement is hard the permanent 7 mm posterior stabilized rotating platform insert is placed into the tibial tray.      The wound is copiously irrigated with saline solution and the extensor mechanism closed with # 0 Stratofix suture. The tourniquet is released for a total tourniquet time of 29  minutes. Flexion against gravity is 140 degrees and the patella tracks normally. Subcutaneous tissue is closed with 2.0 vicryl and subcuticular with running 4.0 Monocryl. The incision is cleaned and dried and steri-strips and a bulky sterile dressing are applied. The limb is placed into a knee immobilizer and the patient is awakened and transported to recovery in stable condition.      Please note that a surgical assistant was a medical necessity for this procedure in order to perform it in a safe and expeditious manner. Surgical assistant was necessary to retract the ligaments and vital neurovascular structures to prevent injury to them and also necessary for proper positioning of the limb to allow for anatomic placement of the prosthesis.   Dione Plover Jevon Littlepage, MD    11/19/2021, 12:12 PM

## 2021-11-19 NOTE — Discharge Instructions (Signed)
 Frank Aluisio, MD Total Joint Specialist [facility] [place] [address] [place], [place] [address] [phone]  TOTAL KNEE REPLACEMENT POSTOPERATIVE DIRECTIONS    Knee Rehabilitation, Guidelines Following Surgery  Results after knee surgery are often greatly improved when you follow the exercise, range of motion and muscle strengthening exercises prescribed by your doctor. Safety measures are also important to protect the knee from further injury. If any of these exercises cause you to have increased pain or swelling in your knee joint, decrease the amount until you are comfortable again and slowly increase them. If you have problems or questions, call your caregiver or physical therapist for advice.   BLOOD CLOT PREVENTION Take a 325 mg Aspirin two times a day for three weeks following surgery. Then resume one 81 mg Aspirin once a day. You may resume your vitamins/supplements upon discharge from the hospital. Do not take any NSAIDs (Advil, Aleve, Ibuprofen, Meloxicam, etc.) until you have discontinued the 325 mg Aspirin.  HOME CARE INSTRUCTIONS  Remove items at home which could result in a fall. This includes throw rugs or furniture in walking pathways.  ICE to the affected knee as much as tolerated. Icing helps control swelling. If the swelling is well controlled you will be more comfortable and rehab easier. Continue to use ice on the knee for pain and swelling from surgery. You may notice swelling that will progress down to the foot and ankle. This is normal after surgery. Elevate the leg when you are not up walking on it.    Continue to use the breathing machine which will help keep your temperature down. It is common for your temperature to cycle up and down following surgery, especially at night when you are not up moving around and exerting yourself. The breathing machine keeps your lungs expanded and your temperature down. Do not place pillow under the  operative knee, focus on keeping the knee straight while resting  DIET You may resume your previous home diet once you are discharged from the hospital.  DRESSING / WOUND CARE / SHOWERING Keep your bulky bandage on for 2 days. On the third post-operative day you may remove the Ace bandage and gauze. There is a waterproof adhesive bandage on your skin which will stay in place until your first follow-up appointment. Once you remove this you will not need to place another bandage You may begin showering 3 days following surgery, but do not submerge the incision under water.  ACTIVITY For the first 5 days, the key is rest and control of pain and swelling Do your home exercises twice a day starting on post-operative day 3. On the days you go to physical therapy, just do the home exercises once that day. You should rest, ice and elevate the leg for 50 minutes out of every hour. Get up and walk/stretch for 10 minutes per hour. After 5 days you can increase your activity slowly as tolerated. Walk with your walker as instructed. Use the walker until you are comfortable transitioning to a cane. Walk with the cane in the opposite hand of the operative leg. You may discontinue the cane once you are comfortable and walking steadily. Avoid periods of inactivity such as sitting longer than an hour when not asleep. This helps prevent blood clots.  You may discontinue the knee immobilizer once you are able to perform a straight leg raise while lying down. You may resume a sexual relationship in one month or when given the OK by   your doctor.  You may return to work once you are cleared by your doctor.  Do not drive a car for 6 weeks or until released by your surgeon.  Do not drive while taking narcotics.  TED HOSE STOCKINGS Wear the elastic stockings on both legs for three weeks following surgery during the day. You may remove them at night for sleeping.  WEIGHT BEARING Weight bearing as tolerated with assist  device (walker, cane, etc) as directed, use it as long as suggested by your surgeon or therapist, typically at least 4-6 weeks.  POSTOPERATIVE CONSTIPATION PROTOCOL Constipation - defined medically as fewer than three stools per week and severe constipation as less than one stool per week.  One of the most common issues patients have following surgery is constipation.  Even if you have a regular bowel pattern at home, your normal regimen is likely to be disrupted due to multiple reasons following surgery.  Combination of anesthesia, postoperative narcotics, change in appetite and fluid intake all can affect your bowels.  In order to avoid complications following surgery, here are some recommendations in order to help you during your recovery period.  Colace (docusate) - Pick up an over-the-counter form of Colace or another stool softener and take twice a day as long as you are requiring postoperative pain medications.  Take with a full glass of water daily.  If you experience loose stools or diarrhea, hold the colace until you stool forms back up. If your symptoms do not get better within 1 week or if they get worse, check with your doctor. Dulcolax (bisacodyl) - Pick up over-the-counter and take as directed by the product packaging as needed to assist with the movement of your bowels.  Take with a full glass of water.  Use this product as needed if not relieved by Colace only.  MiraLax (polyethylene glycol) - Pick up over-the-counter to have on hand. MiraLax is a solution that will increase the amount of water in your bowels to assist with bowel movements.  Take as directed and can mix with a glass of water, juice, soda, coffee, or tea. Take if you go more than two days without a movement. Do not use MiraLax more than once per day. Call your doctor if you are still constipated or irregular after using this medication for 7 days in a row.  If you continue to have problems with postoperative constipation,  please contact the office for further assistance and recommendations.  If you experience "the worst abdominal pain ever" or develop nausea or vomiting, please contact the office immediatly for further recommendations for treatment.  ITCHING If you experience itching with your medications, try taking only a single pain pill, or even half a pain pill at a time.  You can also use Benadryl over the counter for itching or also to help with sleep.   MEDICATIONS See your medication summary on the "After Visit Summary" that the nursing staff will review with you prior to discharge.  You may have some home medications which will be placed on hold until you complete the course of blood thinner medication.  It is important for you to complete the blood thinner medication as prescribed by your surgeon.  Continue your approved medications as instructed at time of discharge.  PRECAUTIONS If you experience chest pain or shortness of breath - call 911 immediately for transfer to the hospital emergency department.  If you develop a fever greater that 101 F, purulent drainage from wound, increased redness   or drainage from wound, foul odor from the wound/dressing, or calf pain - CONTACT YOUR SURGEON.                                                   FOLLOW-UP APPOINTMENTS Make sure you keep all of your appointments after your operation with your surgeon and caregivers. You should call the office at the above phone number and make an appointment for approximately two weeks after the date of your surgery or on the date instructed by your surgeon outlined in the "After Visit Summary".  RANGE OF MOTION AND STRENGTHENING EXERCISES  Rehabilitation of the knee is important following a knee injury or an operation. After just a few days of immobilization, the muscles of the thigh which control the knee become weakened and shrink (atrophy). Knee exercises are designed to build up the tone and strength of the thigh muscles and to  improve knee motion. Often times heat used for twenty to thirty minutes before working out will loosen up your tissues and help with improving the range of motion but do not use heat for the first two weeks following surgery. These exercises can be done on a training (exercise) mat, on the floor, on a table or on a bed. Use what ever works the best and is most comfortable for you Knee exercises include:  Leg Lifts - While your knee is still immobilized in a splint or cast, you can do straight leg raises. Lift the leg to 60 degrees, hold for 3 sec, and slowly lower the leg. Repeat 10-20 times 2-3 times daily. Perform this exercise against resistance later as your knee gets better.  Quad and Hamstring Sets - Tighten up the muscle on the front of the thigh (Quad) and hold for 5-10 sec. Repeat this 10-20 times hourly. Hamstring sets are done by pushing the foot backward against an object and holding for 5-10 sec. Repeat as with quad sets.  Leg Slides: Lying on your back, slowly slide your foot toward your buttocks, bending your knee up off the floor (only go as far as is comfortable). Then slowly slide your foot back down until your leg is flat on the floor again. Angel Wings: Lying on your back spread your legs to the side as far apart as you can without causing discomfort.  A rehabilitation program following serious knee injuries can speed recovery and prevent re-injury in the future due to weakened muscles. Contact your doctor or a physical therapist for more information on knee rehabilitation.   POST-OPERATIVE OPIOID TAPER INSTRUCTIONS: It is important to wean off of your opioid medication as soon as possible. If you do not need pain medication after your surgery it is ok to stop day one. Opioids include: Codeine, Hydrocodone(Norco, Vicodin), Oxycodone(Percocet, oxycontin) and hydromorphone amongst others.  Long term and even short term use of opiods can cause: Increased pain  response Dependence Constipation Depression Respiratory depression And more.  Withdrawal symptoms can include Flu like symptoms Nausea, vomiting And more Techniques to manage these symptoms Hydrate well Eat regular healthy meals Stay active Use relaxation techniques(deep breathing, meditating, yoga) Do Not substitute Alcohol to help with tapering If you have been on opioids for less than two weeks and do not have pain than it is ok to stop all together.  Plan to wean off of opioids This   plan should start within one week post op of your joint replacement. Maintain the same interval or time between taking each dose and first decrease the dose.  Cut the total daily intake of opioids by one tablet each day Next start to increase the time between doses. The last dose that should be eliminated is the evening dose.   IF YOU ARE TRANSFERRED TO A SKILLED REHAB FACILITY If the patient is transferred to a skilled rehab facility following release from the hospital, a list of the current medications will be sent to the facility for the patient to continue.  When discharged from the skilled rehab facility, please have the facility set up the patient's Home Health Physical Therapy prior to being released. Also, the skilled facility will be responsible for providing the patient with their medications at time of release from the facility to include their pain medication, the muscle relaxants, and their blood thinner medication. If the patient is still at the rehab facility at time of the two week follow up appointment, the skilled rehab facility will also need to assist the patient in arranging follow up appointment in our office and any transportation needs.  MAKE SURE YOU:  Understand these instructions.  Get help right away if you are not doing well or get worse.   DENTAL ANTIBIOTICS:  In most cases prophylactic antibiotics for Dental procdeures after total joint surgery are not  necessary.  Exceptions are as follows:  1. History of prior total joint infection  2. Severely immunocompromised (Organ Transplant, cancer chemotherapy, Rheumatoid biologic medications such as Humera)  3. Poorly controlled diabetes (A1C &gt; 8.0, blood glucose over 200)  If you have one of these conditions, contact your surgeon for an antibiotic prescription, prior to your dental procedure.    Pick up stool softner and laxative for home use following surgery while on pain medications. Do not submerge incision under water. Please use good hand washing techniques while changing dressing each day. May shower starting three days after surgery. Please use a clean towel to pat the incision dry following showers. Continue to use ice for pain and swelling after surgery. Do not use any lotions or creams on the incision until instructed by your surgeon.  

## 2021-11-19 NOTE — Anesthesia Postprocedure Evaluation (Signed)
Anesthesia Post Note  Patient: [name]  Procedure(s) Performed: TOTAL KNEE ARTHROPLASTY (Left: Knee)     Patient location during evaluation: PACU Anesthesia Type: Regional, Spinal and MAC Level of consciousness: awake and alert and oriented Pain management: pain level controlled Vital Signs Assessment: post-procedure vital signs reviewed and stable Respiratory status: spontaneous breathing, nonlabored ventilation and respiratory function stable Cardiovascular status: blood pressure returned to baseline and stable Postop Assessment: no headache, no backache, spinal receding and patient able to bend at knees Anesthetic complications: no   No notable events documented.  Last Vitals:  Vitals:   11/19/21 1309 11/19/21 1315  BP: (!) 144/63 (!) 153/86  Pulse: 66 64  Resp: 13 20  Temp:    SpO2: 99% 98%    Last Pain:  Vitals:   11/19/21 1315  TempSrc:   PainSc: 0-No pain                 Pervis Hocking

## 2021-11-19 NOTE — Plan of Care (Signed)
  Problem: Nutrition: Goal: Adequate nutrition will be maintained Outcome: Progressing   Problem: Coping: Goal: Level of anxiety will decrease Outcome: Progressing   Problem: Elimination: Goal: Will not experience complications related to urinary retention Outcome: Progressing   Problem: Pain Managment: Goal: General experience of comfort will improve Outcome: Progressing   

## 2021-11-19 NOTE — Transfer of Care (Signed)
Immediate Anesthesia Transfer of Care Note  Patient: Christina Melendez  Procedure(s) Performed: TOTAL KNEE ARTHROPLASTY (Left: Knee)  Patient Location: PACU  Anesthesia Type:Spinal  Level of Consciousness: awake, alert , oriented and patient cooperative  Airway & Oxygen Therapy: Patient Spontanous Breathing and Patient connected to face mask oxygen  Post-op Assessment: Report given to RN and Post -op Vital signs reviewed and stable  Post vital signs: Reviewed and stable  Last Vitals:  Vitals Value Taken Time  BP 125/61 11/19/21 1234  Temp    Pulse 67 11/19/21 1237  Resp 15 11/19/21 1237  SpO2 98 % 11/19/21 1237  Vitals shown include unvalidated device data.  Last Pain:  Vitals:   11/19/21 0905  TempSrc:   PainSc: 0-No pain      Patients Stated Pain Goal: 4 (44/45/84 8350)  Complications: No notable events documented.

## 2021-11-19 NOTE — Progress Notes (Signed)
Orthopedic Tech Progress Note Patient Details:  Christina Melendez 06/30/1945 [id number]  CPM Left Knee CPM Left Knee: On Left Knee Flexion (Degrees): 40 Left Knee Extension (Degrees): 10  Post Interventions Patient Tolerated: Well  [name] 11/19/2021, 12:43 PM

## 2021-11-19 NOTE — Care Plan (Signed)
Ortho Bundle Case Management Note  Patient Details  Name: Christina Melendez MRN: [id number] Date of Birth: 09-28-1945  L TKA on 11-19-21 DCP:  Home with husband DME:  No needs, has a RW PT:  [facility] on 11-22-21                   DME Arranged:  N/A DME Agency:  NA  HH Arranged:  NA HH Agency:  NA  Additional Comments: Please contact me with any questions of if this plan should need to change.  Marianne Sofia, RN,CCM [facility]  [phone] 11/19/2021, 10:46 AM

## 2021-11-20 ENCOUNTER — Other Ambulatory Visit (HOSPITAL_COMMUNITY): Payer: Self-pay

## 2021-11-20 ENCOUNTER — Encounter (HOSPITAL_COMMUNITY): Payer: Self-pay | Admitting: Orthopedic Surgery

## 2021-11-20 DIAGNOSIS — I10 Essential (primary) hypertension: Secondary | ICD-10-CM | POA: Diagnosis not present

## 2021-11-20 DIAGNOSIS — Z7982 Long term (current) use of aspirin: Secondary | ICD-10-CM | POA: Diagnosis not present

## 2021-11-20 DIAGNOSIS — Z79899 Other long term (current) drug therapy: Secondary | ICD-10-CM | POA: Diagnosis not present

## 2021-11-20 DIAGNOSIS — M1712 Unilateral primary osteoarthritis, left knee: Secondary | ICD-10-CM | POA: Diagnosis not present

## 2021-11-20 LAB — BASIC METABOLIC PANEL
Anion gap: 6 (ref 5–15)
BUN: 11 mg/dL (ref 8–23)
CO2: 22 mmol/L (ref 22–32)
Calcium: 9.2 mg/dL (ref 8.9–10.3)
Chloride: 110 mmol/L (ref 98–111)
Creatinine, Ser: 0.69 mg/dL (ref 0.44–1.00)
GFR, Estimated: >60 mL/min (ref >60)
Glucose, Bld: 171 mg/dL — ABNORMAL HIGH (ref 70–99)
Potassium: 3.8 mmol/L (ref 3.5–5.1)
Sodium: 138 mmol/L (ref 135–145)

## 2021-11-20 LAB — CBC
HCT: 31.5 % — ABNORMAL LOW (ref 36.0–46.0)
Hemoglobin: 9.8 g/dL — ABNORMAL LOW (ref 12.0–15.0)
MCH: 27.5 pg (ref 26.0–34.0)
MCHC: 31.1 g/dL (ref 30.0–36.0)
MCV: 88.5 fL (ref 80.0–100.0)
Platelets: 229 10*3/uL (ref 150–400)
RBC: 3.56 MIL/uL — ABNORMAL LOW (ref 3.87–5.11)
RDW: 13.3 % (ref 11.5–15.5)
WBC: 14.3 10*3/uL — ABNORMAL HIGH (ref 4.0–10.5)
nRBC: 0 % (ref 0.0–0.2)

## 2021-11-20 MED ORDER — ASPIRIN 325 MG PO TBEC
325.0000 mg | DELAYED_RELEASE_TABLET | Freq: Two times a day (BID) | ORAL | 0 refills | Status: AC
  Filled 2021-11-20: qty 40, 20d supply, fill #0

## 2021-11-20 MED ORDER — METHOCARBAMOL 500 MG PO TABS
500.0000 mg | ORAL_TABLET | Freq: Four times a day (QID) | ORAL | 0 refills | Status: DC | PRN
  Filled 2021-11-20: qty 40, 10d supply, fill #0

## 2021-11-20 MED ORDER — OXYCODONE HCL 5 MG PO TABS
5.0000 mg | ORAL_TABLET | Freq: Four times a day (QID) | ORAL | 0 refills | Status: DC | PRN
  Filled 2021-11-20: qty 42, 6d supply, fill #0
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
## 2021-11-20 NOTE — Progress Notes (Signed)
Patient discharged to home w/ family. Given all belongings, instructions, medications. Verbalized understanding of all instructions. Escorted to pov via w/c.

## 2021-11-20 NOTE — TOC Transition Note (Signed)
Transition of Care [facility]) - CM/SW Discharge Note   Patient Details  Name: Christina Melendez MRN: [id number] Date of Birth: 07-Feb-1946  [facility]) CM/SW Contact:  Ross Ludwig, LCSW Phone Number: 11/20/2021, 12:44 PM   Clinical Narrative:     Patient was prearranged for outpatient PT.  CSW spoke to patient, she did not need any equipment she already has it at home.  CSW signing off, please reconsult if TOC needs arise.   Final next level of care: OP Rehab Barriers to Discharge: Barriers Resolved   Patient Goals and CMS Choice Patient states their goals for this hospitalization and ongoing recovery are:: To return back home.      Discharge Placement                       Discharge Plan and Services                DME Arranged: N/A DME Agency: NA       HH Arranged: NA HH Agency: NA        Social Determinants of Health (SDOH) Interventions     Readmission Risk Interventions     No data to display
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
## 2021-11-22 DIAGNOSIS — M25562 Pain in left knee: Secondary | ICD-10-CM | POA: Diagnosis not present

## 2021-11-22 DIAGNOSIS — M25662 Stiffness of left knee, not elsewhere classified: Secondary | ICD-10-CM | POA: Diagnosis not present

## 2021-11-28 DIAGNOSIS — M25562 Pain in left knee: Secondary | ICD-10-CM | POA: Diagnosis not present

## 2021-11-28 DIAGNOSIS — M25662 Stiffness of left knee, not elsewhere classified: Secondary | ICD-10-CM | POA: Diagnosis not present

## 2021-11-30 DIAGNOSIS — M25662 Stiffness of left knee, not elsewhere classified: Secondary | ICD-10-CM | POA: Diagnosis not present

## 2021-11-30 DIAGNOSIS — M25562 Pain in left knee: Secondary | ICD-10-CM | POA: Diagnosis not present

## 2021-12-10 ENCOUNTER — Emergency Department (HOSPITAL_COMMUNITY)
Admission: EM | Admit: 2021-12-10 | Discharge: 2021-12-10 | Disposition: A | Discharge status: Home / Self Care | Payer: Medicare HMO | Attending: Emergency Medicine | Admitting: Emergency Medicine

## 2021-12-10 ENCOUNTER — Encounter (HOSPITAL_COMMUNITY): Payer: Self-pay

## 2021-12-10 ENCOUNTER — Emergency Department (HOSPITAL_COMMUNITY): Payer: Medicare HMO

## 2021-12-10 DIAGNOSIS — R42 Dizziness and giddiness: Secondary | ICD-10-CM | POA: Diagnosis not present

## 2021-12-10 DIAGNOSIS — J9811 Atelectasis: Secondary | ICD-10-CM | POA: Diagnosis not present

## 2021-12-10 DIAGNOSIS — R791 Abnormal coagulation profile: Secondary | ICD-10-CM | POA: Diagnosis not present

## 2021-12-10 DIAGNOSIS — R002 Palpitations: Secondary | ICD-10-CM | POA: Insufficient documentation

## 2021-12-10 LAB — COMPREHENSIVE METABOLIC PANEL
ALT: 14 U/L (ref 0–44)
AST: 19 U/L (ref 15–41)
Albumin: 3.9 g/dL (ref 3.5–5.0)
Alkaline Phosphatase: 87 U/L (ref 38–126)
Anion gap: 8 (ref 5–15)
BUN: 22 mg/dL (ref 8–23)
CO2: 25 mmol/L (ref 22–32)
Calcium: 10.1 mg/dL (ref 8.9–10.3)
Chloride: 105 mmol/L (ref 98–111)
Creatinine, Ser: 0.8 mg/dL (ref 0.44–1.00)
GFR, Estimated: >60 mL/min (ref >60)
Glucose, Bld: 125 mg/dL — ABNORMAL HIGH (ref 70–99)
Potassium: 4.4 mmol/L (ref 3.5–5.1)
Sodium: 138 mmol/L (ref 135–145)
Total Bilirubin: 0.6 mg/dL (ref 0.3–1.2)
Total Protein: 6.8 g/dL (ref 6.5–8.1)

## 2021-12-10 LAB — TROPONIN I (HIGH SENSITIVITY)
Troponin I (High Sensitivity): 9 ng/L (ref <18)
Troponin I (High Sensitivity): 8 ng/L (ref <18)

## 2021-12-10 LAB — CBC WITH DIFFERENTIAL/PLATELET
Abs Immature Granulocytes: 0.02 10*3/uL (ref 0.00–0.07)
Basophils Absolute: 0 10*3/uL (ref 0.0–0.1)
Basophils Relative: 0 %
Eosinophils Absolute: 0.1 10*3/uL (ref 0.0–0.5)
Eosinophils Relative: 1 %
HCT: 37.8 % (ref 36.0–46.0)
Hemoglobin: 11.8 g/dL — ABNORMAL LOW (ref 12.0–15.0)
Immature Granulocytes: 0 %
Lymphocytes Relative: 18 %
Lymphs Abs: 1.2 10*3/uL (ref 0.7–4.0)
MCH: 27.6 pg (ref 26.0–34.0)
MCHC: 31.2 g/dL (ref 30.0–36.0)
MCV: 88.3 fL (ref 80.0–100.0)
Monocytes Absolute: 0.5 10*3/uL (ref 0.1–1.0)
Monocytes Relative: 8 %
Neutro Abs: 4.8 10*3/uL (ref 1.7–7.7)
Neutrophils Relative %: 73 %
Platelets: 319 10*3/uL (ref 150–400)
RBC: 4.28 MIL/uL (ref 3.87–5.11)
RDW: 15 % (ref 11.5–15.5)
WBC: 6.7 10*3/uL (ref 4.0–10.5)
nRBC: 0 % (ref 0.0–0.2)

## 2021-12-10 LAB — D-DIMER, QUANTITATIVE: D-Dimer, Quant: 2.27 ug/mL-FEU — ABNORMAL HIGH (ref 0.00–0.50)

## 2021-12-10 LAB — BRAIN NATRIURETIC PEPTIDE: B Natriuretic Peptide: 109.2 pg/mL — ABNORMAL HIGH (ref 0.0–100.0)

## 2021-12-10 MED ORDER — IOHEXOL 350 MG/ML SOLN
75.0000 mL | Freq: Once | INTRAVENOUS | Status: AC | PRN
  Administered 2021-12-10: 75 mL via INTRAVENOUS

## 2021-12-10 MED ORDER — SODIUM CHLORIDE (PF) 0.9 % IJ SOLN
INTRAMUSCULAR | Status: AC
  Filled 2021-12-10: qty 50

## 2021-12-10 NOTE — ED Provider Notes (Signed)
[facility]-EMERGENCY DEPT Provider Note   CSN: [id number] Arrival date & time: 12/10/21  0941     History  Chief Complaint  Patient presents with   Dizziness   Palpitations    Christina Melendez is a 76 y.o. female.  HPI Patient presents for evaluation of ongoing dizziness which occurs usually in the morning and improves later on.  She has previously been evaluated by this and it has been present for several months.  She talked to her cardiologist about it when she was getting preop clearance for left knee surgery, in February 2023.  She has had prior cardiac echo and cardiac CT.  She has ongoing systolic murmur, without diagnostic abnormality on cardiac echo or clinical evaluation.  She denies fever, chills, cough, focal weakness or paresthesia.    Home Medications Prior to Admission medications   Medication Sig Start Date End Date Taking? Authorizing Provider  acetaminophen (TYLENOL) 500 MG tablet Take 1,000 mg by mouth every 6 (six) hours as needed for moderate pain.    [provider]  Calcium Carb-Cholecalciferol (CALCIUM + D3 PO) Take 2 tablets by mouth daily.    [provider]  esomeprazole (NEXIUM) 20 MG capsule Take 20 mg by mouth daily.    [provider]  estradiol (ESTRACE) 1 MG tablet Take 1 mg by mouth daily. 03/31/18   [provider]  FLUoxetine (PROZAC) 20 MG capsule Take 20 mg by mouth daily. 03/19/18   [provider]  meclizine (ANTIVERT) 25 MG tablet Take 25 mg by mouth daily. 11/16/20   [provider]  methocarbamol (ROBAXIN) 500 MG tablet Take 1 tablet by mouth every 6 hours as needed for muscle spasms. 11/20/21   Edmisten, Lyn Hollingshead, PA  metoprolol tartrate (LOPRESSOR) 50 MG tablet Take 1 tablet by mouth twice daily Patient taking differently: Pt takes once daily in the am 10/09/21   Lanier Prude, MD  ondansetron (ZOFRAN) 4 MG tablet Take 4 mg by mouth every 8 (eight) hours as  needed for nausea or vomiting.    [provider]  oxyCODONE (OXY IR/ROXICODONE) 5 MG immediate release tablet Take 1 - 2 tablets by mouth every 6 hours as needed for severe pain.  **Do not take with hydrocodone/apap** 11/20/21   Edmisten, Kristie L, PA  temazepam (RESTORIL) 30 MG capsule Take 30 mg by mouth at bedtime.    [provider]  traMADol (ULTRAM) 50 MG tablet Take 50 mg by mouth 3 (three) times daily.    [provider]      Allergies    Patient has no known allergies.    Review of Systems   Review of Systems  Physical Exam Updated Vital Signs BP 134/80   Pulse 79   Temp 97.9 F (36.6 C) (Oral)   Resp 16   SpO2 99%  Physical Exam Vitals and nursing note reviewed.  Constitutional:      General: She is not in acute distress.    Appearance: She is well-developed. She is not ill-appearing, toxic-appearing or diaphoretic.  HENT:     Head: Normocephalic and atraumatic.     Right Ear: External ear normal.     Left Ear: External ear normal.  Eyes:     Conjunctiva/sclera: Conjunctivae normal.     Pupils: Pupils are equal, round, and reactive to light.  Neck:     Trachea: Phonation normal.  Cardiovascular:     Rate and Rhythm: Normal rate and regular rhythm.  Heart sounds: Normal heart sounds.  Pulmonary:     Effort: Pulmonary effort is normal. No respiratory distress.     Breath sounds: Normal breath sounds. No stridor.  Abdominal:     General: There is no distension.     Palpations: Abdomen is soft.     Tenderness: There is no abdominal tenderness.  Musculoskeletal:        General: Normal range of motion.     Cervical back: Normal range of motion and neck supple.  Skin:    General: Skin is warm and dry.  Neurological:     Mental Status: She is alert and oriented to person, place, and time.     Cranial Nerves: No cranial nerve deficit.     Sensory: No sensory deficit.     Motor: No abnormal muscle tone.     Coordination:  Coordination normal.  Psychiatric:        Mood and Affect: Mood normal.        Behavior: Behavior normal.        Thought Content: Thought content normal.        Judgment: Judgment normal.     ED Results / Procedures / Treatments   Labs (all labs ordered are listed, but only abnormal results are displayed) Labs Reviewed  COMPREHENSIVE METABOLIC PANEL - Abnormal; Notable for the following components:      Result Value   Glucose, Bld 125 (*)    All other components within normal limits  CBC WITH DIFFERENTIAL/PLATELET - Abnormal; Notable for the following components:   Hemoglobin 11.8 (*)    All other components within normal limits  BRAIN NATRIURETIC PEPTIDE - Abnormal; Notable for the following components:   B Natriuretic Peptide 109.2 (*)    All other components within normal limits  D-DIMER, QUANTITATIVE - Abnormal; Notable for the following components:   D-Dimer, Quant 2.27 (*)    All other components within normal limits  TROPONIN I (HIGH SENSITIVITY)  TROPONIN I (HIGH SENSITIVITY)    EKG EKG Interpretation  Date/Time:  Monday December 10 2021 10:31:22 EDT Ventricular Rate:  82 PR Interval:  172 QRS Duration: 103 QT Interval:  385 QTC Calculation: 450 R Axis:   -54 Text Interpretation: Sinus rhythm Left anterior fascicular block Left ventricular hypertrophy Anterior Q waves, possibly due to LVH since last tracing no significant change Confirmed by Mancel Bale [id number]) on 12/10/2021 11:24:02 AM  Radiology CT Angio Chest PE W/Cm &/Or Wo Cm  Result Date: 12/10/2021 CLINICAL DATA:  Elevated D-dimer, dizziness, palpitations EXAM: CT ANGIOGRAPHY CHEST WITH CONTRAST TECHNIQUE: Multidetector CT imaging of the chest was performed using the standard protocol during bolus administration of intravenous contrast. Multiplanar CT image reconstructions and MIPs were obtained to evaluate the vascular anatomy. RADIATION DOSE REDUCTION: This exam was performed according to the departmental  dose-optimization program which includes automated exposure control, adjustment of the mA and/or kV according to patient size and/or use of iterative reconstruction technique. CONTRAST:  75mL OMNIPAQUE IOHEXOL 350 MG/ML SOLN IV COMPARISON:  02/04/2020 FINDINGS: Cardiovascular: Atherosclerotic calcifications aorta without aneurysm or dissection. Heart unremarkable. No pericardial effusion. Pulmonary arteries adequately opacified and patent. No evidence of pulmonary embolism. Mediastinum/Nodes: Esophagus unremarkable. No thoracic adenopathy. Base of cervical region normal appearance. Lungs/Pleura: Dependent atelectasis posterior RIGHT lower lobe. Lungs otherwise clear. No pulmonary infiltrate, pleural effusion, or pneumothorax. Upper Abdomen: Unremarkable Musculoskeletal: Unremarkable Review of the MIP images confirms the above findings. IMPRESSION: No evidence of pulmonary embolism. Dependent atelectasis posterior RIGHT lower lobe. Aortic  Atherosclerosis (ICD10-I70.0). Electronically Signed   By: Ulyses Southward M.D.   On: 12/10/2021 14:16    Procedures Procedures    Medications Ordered in ED Medications  iohexol (OMNIPAQUE) 350 MG/ML injection 75 mL (75 mLs Intravenous Contrast Given 12/10/21 1358)  sodium chloride (PF) 0.9 % injection (  Given by Other 12/10/21 1423)    ED Course/ Medical Decision Making/ A&P                           Medical Decision Making Patient presented for what she reports as dizziness that occurs in the morning.  Initially she indicated this was a new problem however she has been prior previously evaluated for this over the last several months.  She does not have any new findings or concerns, when compared with her prior complaints.  Problems Addressed: Dizziness: chronic illness or injury    Details: No change in chronic symptoms  Amount and/or Complexity of Data Reviewed Independent Historian: caregiver    Details: Patient gives most of the history husband supports the  history. External Data Reviewed: radiology and notes.    Details: Patient had preoperative clearance for knee surgery in February 2023.  At that time her cardiac echo and CT results were reviewed and were reassuring.  She was felt to be a good candidate for surgery and did not require cardiology intervention.  She was complaining of dizziness at that time similar to today. Labs: ordered.    Details: CBC, metabolic panel, troponin Radiology: ordered.    Details: CT angio chest -- no infiltrate, no PE, no intrathoracic tumor.  No evidence for pneumonia.  Risk Prescription drug management. Decision regarding hospitalization. Risk Details: Patient complaining of dizziness which is chronic.  Screening evaluation has been done and does not show any cardiac instability, MI, PE, pneumonia, evidence for stroke or metabolic disorders causing weakness.  The patient is stable for discharge with symptomatic treatment.  She does not require hospitalization at this time.           Final Clinical Impression(s) / ED Diagnoses Final diagnoses:  Dizziness    Rx / DC Orders ED Discharge Orders     None         Mancel Bale, MD 12/11/21 1325

## 2021-12-10 NOTE — ED Notes (Signed)
Ambulating Pt around ED pt HR went from 89 to 94 pt O2 level maintained at 95%

## 2021-12-10 NOTE — ED Triage Notes (Signed)
Pt arrived via POV, c/o dizziness, feeling palpitations. States on and off episodes of same for several months. Denies any CP or SOB at this time.

## 2021-12-10 NOTE — Discharge Instructions (Signed)
The testing today does not show any serious problems.  Continue taking your usual medications.  Follow-up with your primary care doctor in a week or so for checkup.

## 2021-12-14 DIAGNOSIS — M25662 Stiffness of left knee, not elsewhere classified: Secondary | ICD-10-CM | POA: Diagnosis not present

## 2021-12-14 DIAGNOSIS — M25562 Pain in left knee: Secondary | ICD-10-CM | POA: Diagnosis not present

## 2021-12-17 DIAGNOSIS — M25662 Stiffness of left knee, not elsewhere classified: Secondary | ICD-10-CM | POA: Diagnosis not present

## 2021-12-17 DIAGNOSIS — M25562 Pain in left knee: Secondary | ICD-10-CM | POA: Diagnosis not present

## 2021-12-20 DIAGNOSIS — M25662 Stiffness of left knee, not elsewhere classified: Secondary | ICD-10-CM | POA: Diagnosis not present

## 2021-12-20 DIAGNOSIS — M25562 Pain in left knee: Secondary | ICD-10-CM | POA: Diagnosis not present

## 2021-12-24 DIAGNOSIS — M25562 Pain in left knee: Secondary | ICD-10-CM | POA: Diagnosis not present

## 2021-12-24 DIAGNOSIS — M25662 Stiffness of left knee, not elsewhere classified: Secondary | ICD-10-CM | POA: Diagnosis not present

## 2021-12-25 ENCOUNTER — Other Ambulatory Visit (HOSPITAL_COMMUNITY): Payer: Medicare HMO

## 2021-12-27 DIAGNOSIS — Z5189 Encounter for other specified aftercare: Secondary | ICD-10-CM | POA: Diagnosis not present

## 2022-01-04 DIAGNOSIS — G8929 Other chronic pain: Secondary | ICD-10-CM | POA: Diagnosis not present

## 2022-01-04 DIAGNOSIS — K219 Gastro-esophageal reflux disease without esophagitis: Secondary | ICD-10-CM | POA: Diagnosis not present

## 2022-01-04 DIAGNOSIS — M150 Primary generalized (osteo)arthritis: Secondary | ICD-10-CM | POA: Diagnosis not present

## 2022-01-04 DIAGNOSIS — M810 Age-related osteoporosis without current pathological fracture: Secondary | ICD-10-CM | POA: Diagnosis not present

## 2022-01-04 DIAGNOSIS — E782 Mixed hyperlipidemia: Secondary | ICD-10-CM | POA: Diagnosis not present

## 2022-01-07 DIAGNOSIS — H43813 Vitreous degeneration, bilateral: Secondary | ICD-10-CM | POA: Diagnosis not present

## 2022-01-07 DIAGNOSIS — H52203 Unspecified astigmatism, bilateral: Secondary | ICD-10-CM | POA: Diagnosis not present

## 2022-01-24 DIAGNOSIS — H6121 Impacted cerumen, right ear: Secondary | ICD-10-CM | POA: Diagnosis not present

## 2022-01-24 DIAGNOSIS — H6981 Other specified disorders of Eustachian tube, right ear: Secondary | ICD-10-CM | POA: Diagnosis not present

## 2022-02-10 ENCOUNTER — Emergency Department (HOSPITAL_COMMUNITY): Payer: Medicare HMO

## 2022-02-10 ENCOUNTER — Encounter (HOSPITAL_COMMUNITY): Payer: Self-pay | Admitting: Emergency Medicine

## 2022-02-10 ENCOUNTER — Other Ambulatory Visit: Payer: Self-pay

## 2022-02-10 ENCOUNTER — Emergency Department (HOSPITAL_COMMUNITY)
Admission: EM | Admit: 2022-02-10 | Discharge: 2022-02-10 | Disposition: A | Discharge status: Home / Self Care | Payer: Medicare HMO | Attending: Emergency Medicine | Admitting: Emergency Medicine

## 2022-02-10 DIAGNOSIS — Z5321 Procedure and treatment not carried out due to patient leaving prior to being seen by health care provider: Secondary | ICD-10-CM | POA: Insufficient documentation

## 2022-02-10 DIAGNOSIS — R0602 Shortness of breath: Secondary | ICD-10-CM | POA: Insufficient documentation

## 2022-02-10 LAB — CBC
HCT: 38 % (ref 36.0–46.0)
Hemoglobin: 11.6 g/dL — ABNORMAL LOW (ref 12.0–15.0)
MCH: 25.6 pg — ABNORMAL LOW (ref 26.0–34.0)
MCHC: 30.5 g/dL (ref 30.0–36.0)
MCV: 83.7 fL (ref 80.0–100.0)
Platelets: 294 10*3/uL (ref 150–400)
RBC: 4.54 MIL/uL (ref 3.87–5.11)
RDW: 14.2 % (ref 11.5–15.5)
WBC: 5.5 10*3/uL (ref 4.0–10.5)
nRBC: 0 % (ref 0.0–0.2)

## 2022-02-10 LAB — BASIC METABOLIC PANEL
Anion gap: 3 — ABNORMAL LOW (ref 5–15)
BUN: 19 mg/dL (ref 8–23)
CO2: 27 mmol/L (ref 22–32)
Calcium: 10.5 mg/dL — ABNORMAL HIGH (ref 8.9–10.3)
Chloride: 110 mmol/L (ref 98–111)
Creatinine, Ser: 0.63 mg/dL (ref 0.44–1.00)
GFR, Estimated: >60 mL/min (ref >60)
Glucose, Bld: 109 mg/dL — ABNORMAL HIGH (ref 70–99)
Potassium: 4.4 mmol/L (ref 3.5–5.1)
Sodium: 140 mmol/L (ref 135–145)

## 2022-02-10 LAB — TROPONIN I (HIGH SENSITIVITY): Troponin I (High Sensitivity): 6 ng/L (ref <18)

## 2022-02-10 MED ORDER — ALBUTEROL SULFATE HFA 108 (90 BASE) MCG/ACT IN AERS
2.0000 | INHALATION_SPRAY | RESPIRATORY_TRACT | Status: DC | PRN
Start: 2022-02-10 — End: 2022-02-11

## 2022-02-10 NOTE — ED Notes (Signed)
Patient gave labels to registration and reports she is leaving.

## 2022-02-10 NOTE — ED Triage Notes (Signed)
Pt reports SHOB after  going to church today. Pt reports it was sudden onset. Denies CP.

## 2022-02-10 NOTE — ED Provider Triage Note (Signed)
Emergency Medicine Provider Triage Evaluation Note  Christina Melendez , a 76 y.o. female  was evaluated in triage.  Pt complains of shortness of breath starting this morning.  She states that she feels like she cannot get a deep breath and feels like there is something blocking the air.  This feeling scares her.  No associated chest pain.  No cough or fevers.  No lower extremity swelling.  Review of Systems  Positive: Shortness of breath Negative: Fever cough  Physical Exam  BP (!) 148/104 (BP Location: Left Arm)   Pulse 71   Temp 97.9 F (36.6 C) (Oral)   Resp (!) 24   Ht '5\' 4"'$  (1.626 m)   Wt 77.1 kg   SpO2 98%   BMI 29.18 kg/m  Gen:   Awake, no distress   Resp:  Normal effort  MSK:   Moves extremities without difficulty  Other:  Heart regular rhythm, systolic murmur heard over the left sternal border; lungs clear to auscultation bilaterally, no respiratory distress  Medical Decision Making  Medically screening exam initiated at 6:08 PM.  Appropriate orders placed.  Christina Melendez was informed that the remainder of the evaluation will be completed by another provider, this initial triage assessment does not replace that evaluation, and the importance of remaining in the ED until their evaluation is complete.     Carlisle Cater, PA-C 02/10/22 1813

## 2022-02-11 ENCOUNTER — Encounter (HOSPITAL_COMMUNITY): Payer: Self-pay | Admitting: Emergency Medicine

## 2022-02-11 ENCOUNTER — Emergency Department (HOSPITAL_COMMUNITY)
Admission: EM | Admit: 2022-02-11 | Discharge: 2022-02-11 | Discharge status: Against Medical Advice | Payer: Medicare HMO | Attending: Emergency Medicine | Admitting: Emergency Medicine

## 2022-02-11 DIAGNOSIS — R0602 Shortness of breath: Secondary | ICD-10-CM | POA: Insufficient documentation

## 2022-02-11 DIAGNOSIS — Z5321 Procedure and treatment not carried out due to patient leaving prior to being seen by health care provider: Secondary | ICD-10-CM | POA: Diagnosis not present

## 2022-02-11 DIAGNOSIS — F419 Anxiety disorder, unspecified: Secondary | ICD-10-CM | POA: Insufficient documentation

## 2022-02-11 NOTE — ED Triage Notes (Addendum)
Patient c/o SOB x2 days. Denies chest pain, cough, fever. Patient adds that she is feeling anxious.

## 2022-02-11 NOTE — ED Provider Triage Note (Signed)
Emergency Medicine Provider Triage Evaluation Note  Christina Melendez , a 76 y.o. female  was evaluated in triage.  Pt complains of shortness of breath.  Presented for the same yesterday but left without being seen.  Reports that sometimes she is "hyperventilating.  Review of Systems  Positive: Shortness of breath Negative: CP  Physical Exam  BP 131/66 (BP Location: Right Arm)   Pulse 75   Temp 98.1 F (36.7 C) (Oral)   Resp 18   SpO2 95%  Gen:   Awake, no distress   Resp:  Normal effort  MSK:   Moves extremities without difficulty  Other:  Regular rhythm, lung sounds clear  Medical Decision Making  Medically screening exam initiated at 3:25 PM.  Appropriate orders placed.  Christina Melendez was informed that the remainder of the evaluation will be completed by another provider, this initial triage assessment does not replace that evaluation, and the importance of remaining in the ED until their evaluation is complete.  Patient reports that she is just going to follow-up with her PCP due to the wait.  Has presently been here for 5 hours upon my entrance for triage.  Ambulated out of the department   Rhae Hammock, PA-C 02/11/22 1527

## 2022-02-12 DIAGNOSIS — I517 Cardiomegaly: Secondary | ICD-10-CM | POA: Diagnosis not present

## 2022-02-12 DIAGNOSIS — R0602 Shortness of breath: Secondary | ICD-10-CM | POA: Diagnosis not present

## 2022-02-12 DIAGNOSIS — F410 Panic disorder [episodic paroxysmal anxiety] without agoraphobia: Secondary | ICD-10-CM | POA: Diagnosis not present

## 2022-02-13 DIAGNOSIS — G894 Chronic pain syndrome: Secondary | ICD-10-CM | POA: Diagnosis not present

## 2022-02-13 DIAGNOSIS — M5136 Other intervertebral disc degeneration, lumbar region: Secondary | ICD-10-CM | POA: Diagnosis not present

## 2022-02-13 DIAGNOSIS — Z79891 Long term (current) use of opiate analgesic: Secondary | ICD-10-CM | POA: Diagnosis not present

## 2022-02-13 DIAGNOSIS — M5416 Radiculopathy, lumbar region: Secondary | ICD-10-CM | POA: Diagnosis not present

## 2022-03-19 DIAGNOSIS — L720 Epidermal cyst: Secondary | ICD-10-CM | POA: Diagnosis not present

## 2022-03-19 DIAGNOSIS — I788 Other diseases of capillaries: Secondary | ICD-10-CM | POA: Diagnosis not present

## 2022-03-24 ENCOUNTER — Other Ambulatory Visit: Payer: Self-pay | Admitting: Cardiology

## 2022-03-26 NOTE — Telephone Encounter (Signed)
This is Dr. Berry's pt 

## 2022-03-26 NOTE — Telephone Encounter (Signed)
Rx refill sent to pharmacy. 

## 2022-04-12 DIAGNOSIS — K219 Gastro-esophageal reflux disease without esophagitis: Secondary | ICD-10-CM | POA: Diagnosis not present

## 2022-04-12 DIAGNOSIS — N182 Chronic kidney disease, stage 2 (mild): Secondary | ICD-10-CM | POA: Diagnosis not present

## 2022-04-12 DIAGNOSIS — M1712 Unilateral primary osteoarthritis, left knee: Secondary | ICD-10-CM | POA: Diagnosis not present

## 2022-04-12 DIAGNOSIS — G4700 Insomnia, unspecified: Secondary | ICD-10-CM | POA: Diagnosis not present

## 2022-04-12 DIAGNOSIS — M810 Age-related osteoporosis without current pathological fracture: Secondary | ICD-10-CM | POA: Diagnosis not present

## 2022-04-12 DIAGNOSIS — I2510 Atherosclerotic heart disease of native coronary artery without angina pectoris: Secondary | ICD-10-CM | POA: Diagnosis not present

## 2022-04-12 DIAGNOSIS — E782 Mixed hyperlipidemia: Secondary | ICD-10-CM | POA: Diagnosis not present

## 2022-04-12 DIAGNOSIS — F3342 Major depressive disorder, recurrent, in full remission: Secondary | ICD-10-CM | POA: Diagnosis not present

## 2022-04-16 DIAGNOSIS — Z1389 Encounter for screening for other disorder: Secondary | ICD-10-CM | POA: Diagnosis not present

## 2022-04-16 DIAGNOSIS — Z0000 Encounter for general adult medical examination without abnormal findings: Secondary | ICD-10-CM | POA: Diagnosis not present

## 2022-04-17 DIAGNOSIS — F3342 Major depressive disorder, recurrent, in full remission: Secondary | ICD-10-CM | POA: Diagnosis not present

## 2022-04-17 DIAGNOSIS — G894 Chronic pain syndrome: Secondary | ICD-10-CM | POA: Diagnosis not present

## 2022-04-17 DIAGNOSIS — R7303 Prediabetes: Secondary | ICD-10-CM | POA: Diagnosis not present

## 2022-04-17 DIAGNOSIS — Z6829 Body mass index (BMI) 29.0-29.9, adult: Secondary | ICD-10-CM | POA: Diagnosis not present

## 2022-04-17 DIAGNOSIS — E559 Vitamin D deficiency, unspecified: Secondary | ICD-10-CM | POA: Diagnosis not present

## 2022-04-17 DIAGNOSIS — I2510 Atherosclerotic heart disease of native coronary artery without angina pectoris: Secondary | ICD-10-CM | POA: Diagnosis not present

## 2022-04-17 DIAGNOSIS — E213 Hyperparathyroidism, unspecified: Secondary | ICD-10-CM | POA: Diagnosis not present

## 2022-04-17 DIAGNOSIS — I700 Atherosclerosis of aorta: Secondary | ICD-10-CM | POA: Diagnosis not present

## 2022-04-17 DIAGNOSIS — M810 Age-related osteoporosis without current pathological fracture: Secondary | ICD-10-CM | POA: Diagnosis not present

## 2022-04-17 DIAGNOSIS — E782 Mixed hyperlipidemia: Secondary | ICD-10-CM | POA: Diagnosis not present

## 2022-04-17 DIAGNOSIS — Z0000 Encounter for general adult medical examination without abnormal findings: Secondary | ICD-10-CM | POA: Diagnosis not present

## 2022-04-17 DIAGNOSIS — F1120 Opioid dependence, uncomplicated: Secondary | ICD-10-CM | POA: Diagnosis not present

## 2022-05-01 ENCOUNTER — Other Ambulatory Visit: Payer: Self-pay | Admitting: Family Medicine

## 2022-05-01 DIAGNOSIS — Z1231 Encounter for screening mammogram for malignant neoplasm of breast: Secondary | ICD-10-CM

## 2022-05-28 DIAGNOSIS — Z760 Encounter for issue of repeat prescription: Secondary | ICD-10-CM | POA: Diagnosis not present

## 2022-05-28 DIAGNOSIS — R232 Flushing: Secondary | ICD-10-CM | POA: Diagnosis not present

## 2022-06-19 DIAGNOSIS — Z79891 Long term (current) use of opiate analgesic: Secondary | ICD-10-CM | POA: Diagnosis not present

## 2022-06-19 DIAGNOSIS — M5136 Other intervertebral disc degeneration, lumbar region: Secondary | ICD-10-CM | POA: Diagnosis not present

## 2022-06-19 DIAGNOSIS — Z5181 Encounter for therapeutic drug level monitoring: Secondary | ICD-10-CM | POA: Diagnosis not present

## 2022-06-19 DIAGNOSIS — Z79899 Other long term (current) drug therapy: Secondary | ICD-10-CM | POA: Diagnosis not present

## 2022-06-19 DIAGNOSIS — M5416 Radiculopathy, lumbar region: Secondary | ICD-10-CM | POA: Diagnosis not present

## 2022-06-19 DIAGNOSIS — G894 Chronic pain syndrome: Secondary | ICD-10-CM | POA: Diagnosis not present

## 2022-06-27 ENCOUNTER — Ambulatory Visit
Admission: RE | Admit: 2022-06-27 | Discharge: 2022-06-27 | Disposition: A | Discharge status: Home / Self Care | Payer: Medicare HMO | Source: Ambulatory Visit | Attending: Family Medicine | Admitting: Family Medicine

## 2022-06-27 DIAGNOSIS — Z1231 Encounter for screening mammogram for malignant neoplasm of breast: Secondary | ICD-10-CM | POA: Diagnosis not present

## 2022-07-15 DIAGNOSIS — H6991 Unspecified Eustachian tube disorder, right ear: Secondary | ICD-10-CM | POA: Diagnosis not present

## 2022-10-16 DIAGNOSIS — M545 Low back pain, unspecified: Secondary | ICD-10-CM | POA: Diagnosis not present

## 2022-10-16 DIAGNOSIS — G4700 Insomnia, unspecified: Secondary | ICD-10-CM | POA: Diagnosis not present

## 2022-10-16 DIAGNOSIS — R7303 Prediabetes: Secondary | ICD-10-CM | POA: Diagnosis not present

## 2022-10-16 DIAGNOSIS — I25119 Atherosclerotic heart disease of native coronary artery with unspecified angina pectoris: Secondary | ICD-10-CM | POA: Diagnosis not present

## 2022-10-16 DIAGNOSIS — F1120 Opioid dependence, uncomplicated: Secondary | ICD-10-CM | POA: Diagnosis not present

## 2022-10-16 DIAGNOSIS — R42 Dizziness and giddiness: Secondary | ICD-10-CM | POA: Diagnosis not present

## 2022-10-16 DIAGNOSIS — I700 Atherosclerosis of aorta: Secondary | ICD-10-CM | POA: Diagnosis not present

## 2022-10-16 DIAGNOSIS — F3342 Major depressive disorder, recurrent, in full remission: Secondary | ICD-10-CM | POA: Diagnosis not present

## 2022-10-17 DIAGNOSIS — M5416 Radiculopathy, lumbar region: Secondary | ICD-10-CM | POA: Diagnosis not present

## 2022-10-17 DIAGNOSIS — M5136 Other intervertebral disc degeneration, lumbar region: Secondary | ICD-10-CM | POA: Diagnosis not present

## 2022-10-17 DIAGNOSIS — G894 Chronic pain syndrome: Secondary | ICD-10-CM | POA: Diagnosis not present

## 2022-10-17 DIAGNOSIS — Z79891 Long term (current) use of opiate analgesic: Secondary | ICD-10-CM | POA: Diagnosis not present

## 2022-11-29 DIAGNOSIS — M1711 Unilateral primary osteoarthritis, right knee: Secondary | ICD-10-CM | POA: Diagnosis not present

## 2022-11-29 DIAGNOSIS — Z96652 Presence of left artificial knee joint: Secondary | ICD-10-CM | POA: Diagnosis not present

## 2022-12-12 ENCOUNTER — Other Ambulatory Visit: Payer: Self-pay | Admitting: Cardiology

## 2023-01-15 DIAGNOSIS — H5203 Hypermetropia, bilateral: Secondary | ICD-10-CM | POA: Diagnosis not present

## 2023-01-15 DIAGNOSIS — H43813 Vitreous degeneration, bilateral: Secondary | ICD-10-CM | POA: Diagnosis not present

## 2023-01-30 DIAGNOSIS — H6123 Impacted cerumen, bilateral: Secondary | ICD-10-CM | POA: Diagnosis not present

## 2023-02-06 ENCOUNTER — Other Ambulatory Visit: Payer: Self-pay

## 2023-02-06 MED ORDER — METOPROLOL TARTRATE 50 MG PO TABS: 50.0000 mg | ORAL_TABLET | Freq: Every day | ORAL | 3 refills | Status: DC

## 2023-02-07 DIAGNOSIS — F3342 Major depressive disorder, recurrent, in full remission: Secondary | ICD-10-CM | POA: Diagnosis not present

## 2023-02-07 DIAGNOSIS — R0602 Shortness of breath: Secondary | ICD-10-CM | POA: Diagnosis not present

## 2023-02-07 DIAGNOSIS — R7303 Prediabetes: Secondary | ICD-10-CM | POA: Diagnosis not present

## 2023-02-07 DIAGNOSIS — R5383 Other fatigue: Secondary | ICD-10-CM | POA: Diagnosis not present

## 2023-02-07 DIAGNOSIS — E213 Hyperparathyroidism, unspecified: Secondary | ICD-10-CM | POA: Diagnosis not present

## 2023-02-07 DIAGNOSIS — M791 Myalgia, unspecified site: Secondary | ICD-10-CM | POA: Diagnosis not present

## 2023-02-11 ENCOUNTER — Telehealth: Payer: Self-pay | Admitting: Cardiovascular Disease

## 2023-02-11 DIAGNOSIS — G4700 Insomnia, unspecified: Secondary | ICD-10-CM | POA: Diagnosis not present

## 2023-02-11 DIAGNOSIS — H00015 Hordeolum externum left lower eyelid: Secondary | ICD-10-CM | POA: Diagnosis not present

## 2023-02-11 DIAGNOSIS — F3342 Major depressive disorder, recurrent, in full remission: Secondary | ICD-10-CM | POA: Diagnosis not present

## 2023-02-11 DIAGNOSIS — M810 Age-related osteoporosis without current pathological fracture: Secondary | ICD-10-CM | POA: Diagnosis not present

## 2023-02-11 DIAGNOSIS — D649 Anemia, unspecified: Secondary | ICD-10-CM | POA: Diagnosis not present

## 2023-02-11 NOTE — Telephone Encounter (Signed)
Dr. Meridee Score - Patient's PCP, calling to see if patient could get a sooner appt than what's available due to elevated BMP of 287. Sending results via fax.  Requesting call back to patient to schedule sooner.

## 2023-02-11 NOTE — Telephone Encounter (Signed)
Will forward this message to Dr. Allyson Sabal and Elicia Lamp regarding Clyde. Brake's call to intake.

## 2023-02-12 ENCOUNTER — Ambulatory Visit: Payer: Medicare HMO | Attending: Physician Assistant | Admitting: Physician Assistant

## 2023-02-12 ENCOUNTER — Encounter: Payer: Self-pay | Admitting: Physician Assistant

## 2023-02-12 VITALS — BP 126/64 | HR 69 | Ht 65.0 in | Wt 174.8 lb

## 2023-02-12 DIAGNOSIS — R0609 Other forms of dyspnea: Secondary | ICD-10-CM

## 2023-02-12 DIAGNOSIS — I2510 Atherosclerotic heart disease of native coronary artery without angina pectoris: Secondary | ICD-10-CM | POA: Diagnosis not present

## 2023-02-12 DIAGNOSIS — M79602 Pain in left arm: Secondary | ICD-10-CM | POA: Diagnosis not present

## 2023-02-12 DIAGNOSIS — R002 Palpitations: Secondary | ICD-10-CM | POA: Diagnosis not present

## 2023-02-12 LAB — TROPONIN T: Troponin T (Highly Sensitive): <6 ng/L (ref 0–14)

## 2023-02-12 MED ORDER — FUROSEMIDE 20 MG PO TABS
ORAL_TABLET | ORAL | 3 refills | Status: DC
Start: 2023-02-12 — End: 2023-03-14

## 2023-02-12 NOTE — Patient Instructions (Signed)
Medication Instructions:  Your physician has recommended you make the following change in your medication:  START Lasix 20 mg taking only on Monday's and Thursday's   *If you need a refill on your cardiac medications before your next appointment, please call your pharmacy*   Lab Work: TODAY:  STAT TROPONIN  2 WEEKS:  BMET  If you have labs (blood work) drawn today and your tests are completely normal, you will receive your results only by: MyChart Message (if you have MyChart) OR A paper copy in the mail If you have any lab test that is abnormal or we need to change your treatment, we will call you to review the results.   Testing/Procedures: Your physician has requested that you have an echocardiogram. Echocardiography is a painless test that uses sound waves to create images of your heart. It provides your doctor with information about the size and shape of your heart and how well your heart's chambers and valves are working. This procedure takes approximately one hour. There are no restrictions for this procedure. Please do NOT wear cologne, perfume, aftershave, or lotions (deodorant is allowed). Please arrive 15 minutes prior to your appointment time.   Your physician has requested that you have a lexiscan myoview. For further information please visit [email or web address] Please follow instruction sheet, BELOW:    You are scheduled for a Myocardial Perfusion Imaging Study on: Please arrive 15 minutes prior to your appointment time for registration and insurance purposes.  The test will take approximately 3 to 4 hours to complete; you may bring reading material.  If someone comes with you to your appointment, they will need to remain in the main lobby due to limited space in the testing area. **If you are pregnant or breastfeeding, please notify the nuclear lab prior to your appointment**  How to prepare for your Myocardial Perfusion Test: Do not eat or drink 3 hours prior to  your test, except you may have water. Do not consume products containing caffeine (regular or decaffeinated) 12 hours prior to your test. (ex: coffee, chocolate, sodas, tea). Do bring a list of your current medications with you.  If not listed below, you may take your medications as normal. Do wear comfortable clothes (no dresses or overalls) and walking shoes, tennis shoes preferred (No heels or open toe shoes are allowed). Do NOT wear cologne, perfume, aftershave, or lotions (deodorant is allowed). If these instructions are not followed, your test will have to be rescheduled.     Follow-Up: At [facility], you and your health needs are our priority.  As part of our continuing mission to provide you with exceptional heart care, we have created designated Provider Care Teams.  These Care Teams include your primary Cardiologist (physician) and Advanced Practice Providers (APPs -  Physician Assistants and Nurse Practitioners) who all work together to provide you with the care you need, when you need it.  We recommend signing up for the patient portal called "MyChart".  Sign up information is provided on this After Visit Summary.  MyChart is used to connect with patients for Virtual Visits (Telemedicine).  Patients are able to view lab/test results, encounter notes, upcoming appointments, etc.  Non-urgent messages can be sent to your provider as well.   To learn more about what you can do with MyChart, go to [email or web address].    Your next appointment:   3 week(s)  Provider:   DR. Almetta Lovely WEAVER, PA-C     Other Instructions

## 2023-02-12 NOTE — Progress Notes (Signed)
Cardiology Office Note:    Date:  02/12/2023  ID:  Christina Melendez, DOB 07/29/1945, MRN [id number] PCP: Christina Pilon, FNP  [facility] Providers Cardiologist:  Christina Batty, MD       Patient Profile:      Coronary artery disease CCTA 12/30/2019: CAC score 27 (45th percentile; mild CAD (mid LAD 25-49, proximal RCA 0-24; ostial RCA not adequately visualized-consider nuclear stress test if clinically indicated Aortic atherosclerosis GERD Shortness of breath w exertion Workup in past unremarkable - NL PFTs, no ILD on CT TTE 01/19/2020: Mild intracavitary gradient, peak gradient 2.7 mmHg, EF 60-65, no RWMA, moderate concentric LVH, GR 1 DD, normal RVSF, normal PASP, trivial MR, RAP 3 Supraventricular Tachycardia  PACs Monitor 11/2020: Sinus rhythm, frequent PACs (9.4%), short run SVT          History of Present Illness:  Discussed the use of AI scribe software for clinical note transcription with the patient, who gave verbal consent to proceed.  Christina Melendez is a 77 y.o. female who returns for evaluation of elevated BNP. On chart review, pt recently had reported BNP of 287. She was last seen in clinic in 07/2021 by Christina Crumble, PA-C. She is here today with her husband. She notes difficultly with shortness of breath since her L TKR in 11/2021. She has been short of breath with mild to mod activity. She has not had chest pain, syncope, orthopnea, leg edema, weight changes. She started having L arm pain yesterday. No injury. Pain is rated 8/10. She has no associated or radiating symptoms.  In retrospect, she thinks that this arm pain has been going on for longer.  SHx: she does not smoke  ROS: See HPI No fevers, chills, cough, melena, hematochezia, vomiting, diarrhea.     Studies Reviewed:   EKG Interpretation Date/Time:  Wednesday February 12 2023 14:11:49 EDT Ventricular Rate:  69 PR Interval:  180 QRS Duration:  104 QT Interval:  402 QTC Calculation: 430 R  Axis:   -57  Text Interpretation: Normal sinus rhythm Left anterior fascicular block Moderate voltage criteria for LVH, may be normal variant ( R in aVL , Cornell product ) Septal Q waves ST & T wave abnormality, consider lateral ischemia Confirmed by Christina Melendez [phone]) on 02/12/2023 2:20:40 PM    Risk Assessment/Calculations:             Physical Exam:   VS:  BP 126/64   Pulse 69   Ht 5\' 5"  (1.651 m)   Wt 174 lb 12.8 oz (79.3 kg)   SpO2 98%   BMI 29.09 kg/m    Wt Readings from Last 3 Encounters:  02/12/23 174 lb 12.8 oz (79.3 kg)  02/10/22 170 lb (77.1 kg)  11/19/21 171 lb 15.9 oz (78 kg)    Constitutional:      Appearance: Healthy appearance. Not in distress.  Neck:     Vascular: No JVR. JVD normal.  Pulmonary:     Breath sounds: Normal breath sounds. No wheezing. No rales.  Cardiovascular:     Normal rate. Regular rhythm.     Murmurs: There is a grade 2/6 harsh systolic murmur at the URSB and ULSB.  Edema:    Peripheral edema absent.  Abdominal:     Palpations: Abdomen is soft.  Skin:    General: Skin is warm and dry.        Assessment and Plan:     Shortness of Breath She notes worsening shortness of breath  with exertion over the past year since her knee surgery.  She did have an extensive workup a couple of years ago with normal PFTs and normal CT.  Her last echocardiogram in 2021 demonstrated mild intracavitary gradient at with peak at 2.7 mmHg and normal EF.  She had moderate LVH and mild diastolic dysfunction at that time.  She has not had orthopnea.  Her exam does not suggest volume overload.  However, she saw primary care recently and had a mildly elevated BNP (287).  She does have a history of coronary artery disease that was nonobstructive by CT in 2021.  She has had some left arm pain that has been fairly constant since yesterday.  However, she also thinks it may have gone on for longer.  Given her prior history with LVH and intracavitary gradient, she may  have hypertrophic cardiomyopathy.  Amyloid is certainly in the differential diagnosis as well.  It is unlikely that she has had significant worsening coronary disease in the past 3 years.  However, we should also rule this out.  Since she did have a intracavitary gradient in the past, I do not want to place her on daily diuretics and run the risk of volume depletion.  However, given her symptoms and elevated BNP I think she does need some diuresis. -Arrange 2D echocardiogram -Arrange Lexiscan Myoview -Start furosemide 20 mg twice weekly -BMET 2 weeks -Follow-up in 3 to 4 weeks -She will likely need cardiac MRI depending upon results of echocardiogram -Consider PYP, SPEP/UPEP to rule out amyloid based on echocardiogram results  Left Arm Pain Etiology not entirely clear.  Question if this could be an anginal equivalent.  Symptoms have been ongoing for at least the last 24 to 48 hours.  EKG does demonstrate some lateral T wave changes concerning for ischemia.  Obtain stat troponin.  If abnormal, send to the emergency room for admission.  If normal, proceed with stress testing as noted.  Coronary artery disease As noted, she had mild nonobstructive disease by coronary CTA in 2021.  Continue aspirin 81 mg daily.  She does not currently take statin therapy.  We can readdress this at follow-up.  PACs/palpitations Overall controlled on beta-blocker therapy.  Continue metoprolol tartrate 50 mg daily.     Informed Consent   Shared Decision Making/Informed Consent The risks [chest pain, shortness of breath, cardiac arrhythmias, dizziness, blood pressure fluctuations, myocardial infarction, stroke/transient ischemic attack, nausea, vomiting, allergic reaction, radiation exposure, metallic taste sensation and life-threatening complications (estimated to be 1 in 10,000)], benefits (risk stratification, diagnosing coronary artery disease, treatment guidance) and alternatives of a nuclear stress test were  discussed in detail with Christina Melendez and she agrees to proceed.     Dispo:  Return in about 4 weeks (around 03/12/2023) for Routine Follow Up with Dr. Allyson Melendez, or Christina Newcomer, PA-C.  Signed, Christina Newcomer, PA-C

## 2023-02-13 DIAGNOSIS — G894 Chronic pain syndrome: Secondary | ICD-10-CM | POA: Diagnosis not present

## 2023-02-13 DIAGNOSIS — M5136 Other intervertebral disc degeneration, lumbar region: Secondary | ICD-10-CM | POA: Diagnosis not present

## 2023-02-13 DIAGNOSIS — M5416 Radiculopathy, lumbar region: Secondary | ICD-10-CM | POA: Diagnosis not present

## 2023-02-13 DIAGNOSIS — Z79891 Long term (current) use of opiate analgesic: Secondary | ICD-10-CM | POA: Diagnosis not present

## 2023-02-21 ENCOUNTER — Encounter (HOSPITAL_COMMUNITY): Payer: Self-pay

## 2023-02-25 NOTE — Telephone Encounter (Signed)
Left a detailed message with instructions about a STRESS TEST on 02/28/23 at 7:15. Per her DPR.

## 2023-02-26 ENCOUNTER — Other Ambulatory Visit: Payer: Medicare HMO

## 2023-02-26 DIAGNOSIS — H01005 Unspecified blepharitis left lower eyelid: Secondary | ICD-10-CM | POA: Diagnosis not present

## 2023-02-27 DIAGNOSIS — D649 Anemia, unspecified: Secondary | ICD-10-CM | POA: Diagnosis not present

## 2023-02-28 ENCOUNTER — Encounter: Payer: Self-pay | Admitting: Physician Assistant

## 2023-02-28 ENCOUNTER — Ambulatory Visit (HOSPITAL_BASED_OUTPATIENT_CLINIC_OR_DEPARTMENT_OTHER): Payer: Medicare HMO

## 2023-02-28 ENCOUNTER — Telehealth: Payer: Self-pay | Admitting: *Deleted

## 2023-02-28 ENCOUNTER — Ambulatory Visit: Payer: Medicare HMO

## 2023-02-28 ENCOUNTER — Ambulatory Visit (HOSPITAL_COMMUNITY): Payer: Medicare HMO | Attending: Physician Assistant

## 2023-02-28 DIAGNOSIS — I272 Pulmonary hypertension, unspecified: Secondary | ICD-10-CM | POA: Insufficient documentation

## 2023-02-28 DIAGNOSIS — R0609 Other forms of dyspnea: Secondary | ICD-10-CM | POA: Diagnosis not present

## 2023-02-28 DIAGNOSIS — I2510 Atherosclerotic heart disease of native coronary artery without angina pectoris: Secondary | ICD-10-CM | POA: Diagnosis not present

## 2023-02-28 DIAGNOSIS — I422 Other hypertrophic cardiomyopathy: Secondary | ICD-10-CM

## 2023-02-28 DIAGNOSIS — R002 Palpitations: Secondary | ICD-10-CM

## 2023-02-28 DIAGNOSIS — I421 Obstructive hypertrophic cardiomyopathy: Secondary | ICD-10-CM

## 2023-02-28 HISTORY — DX: Obstructive hypertrophic cardiomyopathy: I42.1

## 2023-02-28 LAB — ECHOCARDIOGRAM COMPLETE
AR max vel: 3.25 cm2
AV Area VTI: 3.58 cm2
AV Area mean vel: 3.47 cm2
AV Mean grad: 8.5 mm[Hg]
AV Peak grad: 16.2 mm[Hg]
Ao pk vel: 2.02 m/s
Area-P 1/2: 3.14 cm2
Height: 65 in
MV VTI: 2.96 cm2
S' Lateral: 2 cm
Weight: 2784 [oz_av]

## 2023-02-28 LAB — MYOCARDIAL PERFUSION IMAGING
LV dias vol: 49 mL (ref 46–106)
LV sys vol: 17 mL
Nuc Stress EF: 65 %
Peak HR: 104 {beats}/min
Rest HR: 66 {beats}/min
Rest Nuclear Isotope Dose: 10.8 mCi
SDS: 5
SRS: 2
SSS: 7
ST Depression (mm): 0 mm
Stress Nuclear Isotope Dose: 31 mCi
TID: 1.09

## 2023-02-28 MED ORDER — REGADENOSON 0.4 MG/5ML IV SOLN
0.4000 mg | Freq: Once | INTRAVENOUS | Status: AC
  Administered 2023-02-28: 0.4 mg via INTRAVENOUS

## 2023-02-28 MED ORDER — TECHNETIUM TC 99M TETROFOSMIN IV KIT
10.8000 | PACK | Freq: Once | INTRAVENOUS | Status: AC | PRN
  Administered 2023-02-28: 10.8 via INTRAVENOUS

## 2023-02-28 MED ORDER — TECHNETIUM TC 99M TETROFOSMIN IV KIT
31.0000 | PACK | Freq: Once | INTRAVENOUS | Status: AC | PRN
  Administered 2023-02-28: 31 via INTRAVENOUS

## 2023-02-28 NOTE — Telephone Encounter (Signed)
-----   Message from Tereso Newcomer sent at 02/28/2023  1:31 PM EDT ----- Results sent to [facility] via MyChart. See MyChart comments below. I will fwd a copy to Soundra Pilon, FNP as Lorain Childes PLAN:  -Arrange Cardiac MRI (Dx Hypertrophic Obstructive Cardiomyopathy) -Arrange VQ scan (Dx: Pulmonary HTN) -Arrange PFTs with DLCO (Dx: Pulmonary HTN)  Christina Melendez  Your echocardiogram demonstrates normal ejection fraction (heart function).  There is moderate thickening of your heart (left ventricular hypertrophy).  There is moderate stiffness (moderate diastolic dysfunction).  There is mild pulmonary hypertension (elevated lung pressures).  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  This is a type of heart muscle thickening.  I will arrange some further testing to continue to evaluate this.  I will arrange for you to have a cardiac MRI which will better characterize the possible hypertrophic cardiomyopathy.  I will also arrange a VQ scan and pulmonary function tests.  The VQ scan and pulmonary function tests are to better evaluate the pulmonary hypertension.  Our office will contact you to arrange these. Tereso Newcomer, PA-C

## 2023-02-28 NOTE — Telephone Encounter (Signed)
Call placed to pt to let her know orders have been placed for her testing.  Left a message for pt and also sent a mychart message.

## 2023-03-10 DIAGNOSIS — I5030 Unspecified diastolic (congestive) heart failure: Secondary | ICD-10-CM | POA: Insufficient documentation

## 2023-03-10 NOTE — Progress Notes (Unsigned)
Cardiology Office Note:    Date:  03/11/2023  ID:  [name], DOB Aug 20, 1945, MRN [id number] PCP: Soundra Pilon, FNP  [facility] Providers Cardiologist:  Nanetta Batty, MD       Patient Profile:      Coronary artery disease CCTA 12/30/2019: CAC score 27 (45th percentile; mild CAD (mid LAD 25-49, proximal RCA 0-24; ostial RCA not adequately visualized-consider nuclear stress test if clinically indicated Myoview 02/28/2023: No ischemia or infarction, EF 65, low risk  Hypertrophic Obstructive Cardiomyopathy   TTE 01/19/2020: Mild intracavitary gradient, peak gradient 2.7 mmHg, EF 60-65, no RWMA, moderate concentric LVH, GR 1 DD, normal RVSF, normal PASP, trivial MR, RAP 3 TTE 02/28/2023: EF 60-65, no RWMA, moderate asymmetric LVH, very mild LVOT gradient 18 mmHg, GR 2 DD, GLS -20.5, normal RVSF, mild pulmonary hypertension, RVSP 43.4, mild SAM, mild MR, RAP 3  Aortic atherosclerosis GERD Shortness of breath w exertion Workup in past unremarkable - NL PFTs, no ILD on CT Supraventricular Tachycardia  PACs Monitor 11/2020: Sinus rhythm, frequent PACs (9.4%), short run SVT           History of Present Illness:  Discussed the use of AI scribe software for clinical note transcription with the patient, who gave verbal consent to proceed.  Christina Melendez is a 77 y.o. female who returns for follow-up of hypertrophic cardiomyopathy, HFpEF.  She was last seen 02/12/2023 with worsening shortness of breath as well as left arm pain.  Stat troponin was negative.  She had had a BMP with primary care prior to her visit that was mildly elevated.  I placed her on furosemide twice a week.  Nuclear stress test was low risk and negative for ischemia.  Follow-up echocardiogram demonstrated normal EF, moderate asymmetric LVH, very mild LVOT gradient and mild pulmonary hypertension.  There was also mild SAM and findings were consistent with mild variant of HOCM.  VQ scan, PFTs and cardiac MRI  are pending.   She is here with her husband.  We reviewed the findings of her echocardiogram and stress test.  The patient reports chronic shortness of breath, which has not significantly changed since starting on Lasix twice weekly. She continues to have left arm pain. The patient denies any chest discomfort or syncope. She mentions experiencing vertigo about two weeks ago, which lasted for three days and caused her to stay in bed. She also reports some balance issues when walking. The patient denies any recent falls. She does not report any difficulty breathing when lying flat, swelling in her legs, or any bleeding or black stools.     Review of Systems  Musculoskeletal:  Positive for joint pain (L arm).  Neurological:  Positive for vertigo.  See HPI     Studies Reviewed:        Risk Assessment/Calculations:             Physical Exam:   VS:  BP 110/60   Pulse 66   Ht 5\' 5"  (1.651 m)   Wt 174 lb 6.4 oz (79.1 kg)   SpO2 94%   BMI 29.02 kg/m    Wt Readings from Last 3 Encounters:  03/11/23 174 lb 6.4 oz (79.1 kg)  02/28/23 174 lb (78.9 kg)  02/12/23 174 lb 12.8 oz (79.3 kg)    Constitutional:      Appearance: Healthy appearance. Not in distress.  Neck:     Vascular: No JVR. JVD normal.  Pulmonary:     Breath sounds:  Normal breath sounds. No wheezing. No rales.  Cardiovascular:     Normal rate. Regular rhythm.     Murmurs: There is a systolic murmur at the LLSB and ULSB.  Edema:    Peripheral edema absent.  Abdominal:     Palpations: Abdomen is soft.  Skin:    General: Skin is warm and dry.        Assessment and Plan:   Assessment & Plan HOCM (hypertrophic obstructive cardiomyopathy) (HCC) Recent echocardiogram showed mild LVOT gradient of , moderate asymmetric LVH, mild SAM.  Findings were consistent with mild variant hypertrophic cardiomyopathy. She has a hx of chronic shortness of breath. She has not had syncope.  She does not have a family history of sudden  cardiac death or hypertrophic cardiomyopathy.   -Change Metoprolol Tartrate to 25mg  twice daily. -Cardiac MRI is pending. -Consider referral to Dr. Raynelle Jan based upon MRI results. Chronic heart failure with preserved ejection fraction (HCC) As noted, she has chronic shortness of breath. There has been no significant change with initiation of Lasix twice weekly. She is NYHA II-IIb. Volume status appears stable on exam today.  -Continue Lasix 20mg  twice weekly. -BMET, BNP today -Adjust Lasix if needed based on BNP results. Pulmonary hypertension, unspecified (HCC) RVSP 31.5 in 01/2020 on TTE. Her RVSP is now 43.4 on recent echocardiogram. She had seen pulmonology for shortness of breath in 2021. CT was neg for pulmonary fibrosis at that time and her PFTs were normal in 2021. Repeat PFTs and a VQ scan are currently pending. -Consider referral back to pulmonology based on results. Coronary artery disease involving native coronary artery of native heart without angina pectoris Mild non-obstructive disease on coronary CT in 2021. Recent nuclear stress test negative for ischemia and low risk. LDL 88 in 04/2022. Goal LDL < 70.  -Continue Aspirin 81mg  daily. -Start Rosuvastatin 10mg  daily. -Repeat lipid panel, CMET in 3 months. -Follow up 6 mos.  Premature atrial contractions Prior monitor showed PACs and short runs of supraventricular tachycardia. -Continue Metoprolol Tartrate at 25mg  twice daily.          Dispo:  Return in about 6 months (around 09/09/2023) for Routine Follow Up w/ Dr. Allyson Sabal.  Signed, Tereso Newcomer, PA-C

## 2023-03-11 ENCOUNTER — Encounter: Payer: Self-pay | Admitting: Physician Assistant

## 2023-03-11 ENCOUNTER — Ambulatory Visit: Payer: Medicare HMO | Attending: Physician Assistant | Admitting: Physician Assistant

## 2023-03-11 ENCOUNTER — Telehealth: Payer: Self-pay | Admitting: *Deleted

## 2023-03-11 VITALS — BP 110/60 | HR 66 | Ht 65.0 in | Wt 174.4 lb

## 2023-03-11 DIAGNOSIS — Z79899 Other long term (current) drug therapy: Secondary | ICD-10-CM

## 2023-03-11 DIAGNOSIS — I5032 Chronic diastolic (congestive) heart failure: Secondary | ICD-10-CM

## 2023-03-11 DIAGNOSIS — I272 Pulmonary hypertension, unspecified: Secondary | ICD-10-CM | POA: Diagnosis not present

## 2023-03-11 DIAGNOSIS — I491 Atrial premature depolarization: Secondary | ICD-10-CM | POA: Diagnosis not present

## 2023-03-11 DIAGNOSIS — I421 Obstructive hypertrophic cardiomyopathy: Secondary | ICD-10-CM | POA: Diagnosis not present

## 2023-03-11 DIAGNOSIS — I2510 Atherosclerotic heart disease of native coronary artery without angina pectoris: Secondary | ICD-10-CM | POA: Diagnosis not present

## 2023-03-11 MED ORDER — ROSUVASTATIN CALCIUM 10 MG PO TABS: 10.0000 mg | ORAL_TABLET | Freq: Every day | ORAL | 3 refills | Status: DC

## 2023-03-11 MED ORDER — METOPROLOL TARTRATE 25 MG PO TABS: 25.0000 mg | ORAL_TABLET | Freq: Two times a day (BID) | ORAL | 3 refills | Status: DC

## 2023-03-11 NOTE — Assessment & Plan Note (Signed)
RVSP 31.5 in 01/2020 on TTE. Her RVSP is now 43.4 on recent echocardiogram. She had seen pulmonology for shortness of breath in 2021. CT was neg for pulmonary fibrosis at that time and her PFTs were normal in 2021. Repeat PFTs and a VQ scan are currently pending. -Consider referral back to pulmonology based on results.

## 2023-03-11 NOTE — Assessment & Plan Note (Signed)
Mild non-obstructive disease on coronary CT in 2021. Recent nuclear stress test negative for ischemia and low risk. LDL 88 in 04/2022. Goal LDL < 70.  -Continue Aspirin 81mg  daily. -Start Rosuvastatin 10mg  daily. -Repeat lipid panel, CMET in 3 months. -Follow up 6 mos.

## 2023-03-11 NOTE — Assessment & Plan Note (Signed)
Recent echocardiogram showed mild LVOT gradient of , moderate asymmetric LVH, mild SAM.  Findings were consistent with mild variant hypertrophic cardiomyopathy. She has a hx of chronic shortness of breath. She has not had syncope.  She does not have a family history of sudden cardiac death or hypertrophic cardiomyopathy.   -Change Metoprolol Tartrate to 25mg  twice daily. -Cardiac MRI is pending. -Consider referral to Dr. Raynelle Jan based upon MRI results.

## 2023-03-11 NOTE — Assessment & Plan Note (Signed)
As noted, she has chronic shortness of breath. There has been no significant change with initiation of Lasix twice weekly. She is NYHA II-IIb. Volume status appears stable on exam today.  -Continue Lasix 20mg  twice weekly. -BMET, BNP today -Adjust Lasix if needed based on BNP results.

## 2023-03-11 NOTE — Telephone Encounter (Signed)
Left pt a message to call back regarding message below.

## 2023-03-11 NOTE — Telephone Encounter (Signed)
-----   Message from Tereso Newcomer sent at 03/11/2023  4:22 PM EDT ----- Thank you. Victorino Dike can you see if she will go to [facility]? ----- Message ----- From: Dorette Grate, RN Sent: 03/11/2023  11:45 AM EDT To: Lennie Odor, RN; Beatrice Lecher, PA-C  She can get this done before December if she is willing to drive to [facility]. Otherwise, it's only if there is a cancellation at [facility].  Merle ----- Message ----- From: Kennon Rounds Sent: 03/11/2023  11:30 AM EDT To: Lennie Odor, RN; Dorette Grate, RN  Hi Derrick Ravel She has findings c/w Hypertrophic Obstructive Cardiomyopathy on echocardiogram. MRI was scheduled for Dec.  Is there any way to get her MRI done before December? Thanks [employer]

## 2023-03-11 NOTE — Patient Instructions (Signed)
Medication Instructions:  Your physician has recommended you make the following change in your medication:   START Rosuvastatin 10 mg taking 1 daily  STOP Metoprolol 50  START Metoprolol 25 taking 1 twice a day  *If you need a refill on your cardiac medications before your next appointment, please call your pharmacy*   Lab Work: TODAY:  BMET & PRO BNP  3 MONTHS:  LIPID & CMET  If you have labs (blood work) drawn today and your tests are completely normal, you will receive your results only by: MyChart Message (if you have MyChart) OR A paper copy in the mail If you have any lab test that is abnormal or we need to change your treatment, we will call you to review the results.   Testing/Procedures: None ordered today, we will see if we can get your VQ Scan and PFT's scheduled   Follow-Up: At [facility], you and your health needs are our priority.  As part of our continuing mission to provide you with exceptional heart care, we have created designated Provider Care Teams.  These Care Teams include your primary Cardiologist (physician) and Advanced Practice Providers (APPs -  Physician Assistants and Nurse Practitioners) who all work together to provide you with the care you need, when you need it.  We recommend signing up for the patient portal called "MyChart".  Sign up information is provided on this After Visit Summary.  MyChart is used to connect with patients for Virtual Visits (Telemedicine).  Patients are able to view lab/test results, encounter notes, upcoming appointments, etc.  Non-urgent messages can be sent to your provider as well.   To learn more about what you can do with MyChart, go to [email or web address].    Your next appointment:   6 month(s)  Provider:   Nanetta Batty, MD     Other Instructions

## 2023-03-12 LAB — BASIC METABOLIC PANEL
BUN/Creatinine Ratio: 17 (ref 12–28)
BUN: 15 mg/dL (ref 8–27)
CO2: 23 mmol/L (ref 20–29)
Calcium: 10.2 mg/dL (ref 8.7–10.3)
Chloride: 104 mmol/L (ref 96–106)
Creatinine, Ser: 0.87 mg/dL (ref 0.57–1.00)
Glucose: 117 mg/dL — ABNORMAL HIGH (ref 70–99)
Potassium: 4.8 mmol/L (ref 3.5–5.2)
Sodium: 139 mmol/L (ref 134–144)
eGFR: 68 mL/min/{1.73_m2} (ref >59)

## 2023-03-12 LAB — PRO B NATRIURETIC PEPTIDE: NT-Pro BNP: 872 pg/mL — ABNORMAL HIGH (ref 0–738)

## 2023-03-14 MED ORDER — FUROSEMIDE 20 MG PO TABS: 20.0000 mg | ORAL_TABLET | ORAL | 3 refills | Status: AC

## 2023-03-14 NOTE — Telephone Encounter (Signed)
Pt will just keep her appt 05/07/23 in [place].    Pt also been made aware of her lab results.. see result note.

## 2023-03-21 ENCOUNTER — Ambulatory Visit: Payer: Medicare HMO

## 2023-03-26 ENCOUNTER — Ambulatory Visit: Payer: Medicare HMO | Attending: Physician Assistant

## 2023-03-26 DIAGNOSIS — Z79899 Other long term (current) drug therapy: Secondary | ICD-10-CM

## 2023-03-27 ENCOUNTER — Ambulatory Visit (HOSPITAL_COMMUNITY)
Admission: RE | Admit: 2023-03-27 | Discharge: 2023-03-27 | Disposition: A | Discharge status: Home / Self Care | Payer: Medicare HMO | Source: Ambulatory Visit | Attending: Physician Assistant | Admitting: Physician Assistant

## 2023-03-27 ENCOUNTER — Other Ambulatory Visit: Payer: Self-pay | Admitting: Physician Assistant

## 2023-03-27 DIAGNOSIS — I2510 Atherosclerotic heart disease of native coronary artery without angina pectoris: Secondary | ICD-10-CM

## 2023-03-27 DIAGNOSIS — I272 Pulmonary hypertension, unspecified: Secondary | ICD-10-CM

## 2023-03-27 DIAGNOSIS — R0602 Shortness of breath: Secondary | ICD-10-CM | POA: Diagnosis not present

## 2023-03-27 DIAGNOSIS — I421 Obstructive hypertrophic cardiomyopathy: Secondary | ICD-10-CM

## 2023-03-27 DIAGNOSIS — I5032 Chronic diastolic (congestive) heart failure: Secondary | ICD-10-CM

## 2023-03-27 DIAGNOSIS — I491 Atrial premature depolarization: Secondary | ICD-10-CM

## 2023-03-27 DIAGNOSIS — I422 Other hypertrophic cardiomyopathy: Secondary | ICD-10-CM | POA: Diagnosis not present

## 2023-03-27 DIAGNOSIS — I429 Cardiomyopathy, unspecified: Secondary | ICD-10-CM | POA: Diagnosis not present

## 2023-03-27 LAB — BASIC METABOLIC PANEL
BUN/Creatinine Ratio: 12 (ref 12–28)
BUN: 10 mg/dL (ref 8–27)
CO2: 22 mmol/L (ref 20–29)
Calcium: 9.4 mg/dL (ref 8.7–10.3)
Chloride: 103 mmol/L (ref 96–106)
Creatinine, Ser: 0.81 mg/dL (ref 0.57–1.00)
Glucose: 106 mg/dL — ABNORMAL HIGH (ref 70–99)
Potassium: 4.4 mmol/L (ref 3.5–5.2)
Sodium: 138 mmol/L (ref 134–144)
eGFR: 74 mL/min/{1.73_m2} (ref >59)

## 2023-03-27 MED ORDER — TECHNETIUM TO 99M ALBUMIN AGGREGATED
4.4000 | Freq: Once | INTRAVENOUS | Status: AC | PRN
  Administered 2023-03-27: 4.4 via INTRAVENOUS

## 2023-04-01 ENCOUNTER — Ambulatory Visit (HOSPITAL_BASED_OUTPATIENT_CLINIC_OR_DEPARTMENT_OTHER): Payer: Medicare HMO | Admitting: Internal Medicine

## 2023-04-01 DIAGNOSIS — I422 Other hypertrophic cardiomyopathy: Secondary | ICD-10-CM | POA: Diagnosis not present

## 2023-04-01 DIAGNOSIS — I272 Pulmonary hypertension, unspecified: Secondary | ICD-10-CM

## 2023-04-01 LAB — PULMONARY FUNCTION TEST
DL/VA % pred: 102 %
DL/VA: 4.2 ml/min/mmHg/L
DLCO cor % pred: 72 %
DLCO cor: 13.68 ml/min/mmHg
DLCO unc % pred: 72 %
DLCO unc: 13.68 ml/min/mmHg
FEF 25-75 Post: 3.33 L/s
FEF 25-75 Pre: 2.31 L/s
FEF2575-%Change-Post: 44 %
FEF2575-%Pred-Post: 220 %
FEF2575-%Pred-Pre: 153 %
FEV1-%Change-Post: 15 %
FEV1-%Pred-Post: 98 %
FEV1-%Pred-Pre: 84 %
FEV1-Post: 1.97 L
FEV1-Pre: 1.7 L
FEV1FVC-%Change-Post: 4 %
FEV1FVC-%Pred-Pre: 115 %
FEV6-%Change-Post: 12 %
FEV6-%Pred-Post: 86 %
FEV6-%Pred-Pre: 77 %
FEV6-Post: 2.21 L
FEV6-Pre: 1.97 L
FEV6FVC-%Pred-Post: 105 %
FEV6FVC-%Pred-Pre: 105 %
FVC-%Change-Post: 11 %
FVC-%Pred-Post: 82 %
FVC-%Pred-Pre: 73 %
FVC-Post: 2.21 L
FVC-Pre: 1.99 L
Post FEV1/FVC ratio: 89 %
Post FEV6/FVC ratio: 100 %
Pre FEV1/FVC ratio: 86 %
Pre FEV6/FVC Ratio: 100 %
RV % pred: 59 %
RV: 1.4 L
TLC % pred: 75 %
TLC: 3.8 L

## 2023-04-01 NOTE — Patient Instructions (Signed)
Full PFT Performed Today  

## 2023-04-01 NOTE — Progress Notes (Signed)
Full PFT Performed Today  

## 2023-04-17 DIAGNOSIS — M19041 Primary osteoarthritis, right hand: Secondary | ICD-10-CM | POA: Diagnosis not present

## 2023-04-17 DIAGNOSIS — M79642 Pain in left hand: Secondary | ICD-10-CM | POA: Diagnosis not present

## 2023-04-18 ENCOUNTER — Telehealth: Payer: Self-pay | Admitting: Internal Medicine

## 2023-04-18 ENCOUNTER — Other Ambulatory Visit (HOSPITAL_BASED_OUTPATIENT_CLINIC_OR_DEPARTMENT_OTHER): Payer: Self-pay | Admitting: Physician Assistant

## 2023-04-18 DIAGNOSIS — R0602 Shortness of breath: Secondary | ICD-10-CM

## 2023-04-18 DIAGNOSIS — I272 Pulmonary hypertension, unspecified: Secondary | ICD-10-CM

## 2023-04-18 NOTE — Telephone Encounter (Signed)
   Front desk I told the PA PCP to get high-resolution CT chest.  After that the patient should see me.  She was last seen in March 03, 2020.  Therefore it has been 3 years.  It should be a new consult 30 minutes nonurgent.   xxxxxxxxxxxxxxxxxxxxxxxxxxxx Hi Dr. Marchelle Gearing  I wanted to ask you about this pt. You have seen her in the past for shortness of breath with a fairly unremarkable workup at that time.  I saw her in Sept for increasing shortness of breath. Echocardiogram showed normal EF, mod asymmetric LVH, mild systolic anterior motion of mitral valve leaflet, mild LVOT gradient of 18 mmHg, mild pul HTN with RVSP 43.4 and mild MR. VQ was neg for PE. Myoview was neg for ischemia. Cardiac MR is pending to eval for HOCM. The PFTs demonstrated reduced lung volumes, increased FEV1-FVC ratio and diffusion defect suggesting an interstitial process such as fibrosis or interstitial inflammation. In 2021, a high res CT was neg for fibrosis. Do you think the findings on her recent PFTs warrant follow up with you in pulmonology?  Thank you,

## 2023-04-28 DIAGNOSIS — F3342 Major depressive disorder, recurrent, in full remission: Secondary | ICD-10-CM | POA: Diagnosis not present

## 2023-04-28 DIAGNOSIS — I25119 Atherosclerotic heart disease of native coronary artery with unspecified angina pectoris: Secondary | ICD-10-CM | POA: Diagnosis not present

## 2023-04-28 DIAGNOSIS — G4700 Insomnia, unspecified: Secondary | ICD-10-CM | POA: Diagnosis not present

## 2023-04-28 DIAGNOSIS — Z23 Encounter for immunization: Secondary | ICD-10-CM | POA: Diagnosis not present

## 2023-04-28 DIAGNOSIS — Z0000 Encounter for general adult medical examination without abnormal findings: Secondary | ICD-10-CM | POA: Diagnosis not present

## 2023-04-28 DIAGNOSIS — M810 Age-related osteoporosis without current pathological fracture: Secondary | ICD-10-CM | POA: Diagnosis not present

## 2023-04-28 DIAGNOSIS — I700 Atherosclerosis of aorta: Secondary | ICD-10-CM | POA: Diagnosis not present

## 2023-04-28 DIAGNOSIS — E559 Vitamin D deficiency, unspecified: Secondary | ICD-10-CM | POA: Diagnosis not present

## 2023-04-28 DIAGNOSIS — R519 Headache, unspecified: Secondary | ICD-10-CM | POA: Diagnosis not present

## 2023-04-29 DIAGNOSIS — K219 Gastro-esophageal reflux disease without esophagitis: Secondary | ICD-10-CM | POA: Diagnosis not present

## 2023-04-29 DIAGNOSIS — I25119 Atherosclerotic heart disease of native coronary artery with unspecified angina pectoris: Secondary | ICD-10-CM | POA: Diagnosis not present

## 2023-04-29 DIAGNOSIS — I2510 Atherosclerotic heart disease of native coronary artery without angina pectoris: Secondary | ICD-10-CM | POA: Diagnosis not present

## 2023-04-29 DIAGNOSIS — M810 Age-related osteoporosis without current pathological fracture: Secondary | ICD-10-CM | POA: Diagnosis not present

## 2023-04-29 DIAGNOSIS — E559 Vitamin D deficiency, unspecified: Secondary | ICD-10-CM | POA: Diagnosis not present

## 2023-04-29 DIAGNOSIS — R7303 Prediabetes: Secondary | ICD-10-CM | POA: Diagnosis not present

## 2023-04-29 DIAGNOSIS — I700 Atherosclerosis of aorta: Secondary | ICD-10-CM | POA: Diagnosis not present

## 2023-04-29 DIAGNOSIS — D649 Anemia, unspecified: Secondary | ICD-10-CM | POA: Diagnosis not present

## 2023-04-29 DIAGNOSIS — E782 Mixed hyperlipidemia: Secondary | ICD-10-CM | POA: Diagnosis not present

## 2023-05-05 ENCOUNTER — Encounter (HOSPITAL_COMMUNITY): Payer: Self-pay

## 2023-05-06 ENCOUNTER — Telehealth (HOSPITAL_COMMUNITY): Payer: Self-pay | Admitting: Emergency Medicine

## 2023-05-06 NOTE — Telephone Encounter (Signed)
Unable to leave vm Thoren Hosang RN Navigator Cardiac Imaging [facility] [phone] Office  [phone] Cell  

## 2023-05-07 ENCOUNTER — Ambulatory Visit (HOSPITAL_COMMUNITY): Admission: RE | Admit: 2023-05-07 | Payer: Medicare HMO | Source: Ambulatory Visit

## 2023-05-07 ENCOUNTER — Encounter: Payer: Self-pay | Admitting: *Deleted

## 2023-05-15 NOTE — Telephone Encounter (Signed)
Ok that is her choice and I can respect that.

## 2023-05-15 NOTE — Telephone Encounter (Signed)
Patient called and asked me to cancel her CT appt   She did not specify why she wanted to cancel

## 2023-05-21 ENCOUNTER — Ambulatory Visit: Payer: Medicare HMO | Attending: Cardiovascular Disease | Admitting: Cardiovascular Disease

## 2023-05-21 ENCOUNTER — Encounter: Payer: Self-pay | Admitting: Cardiovascular Disease

## 2023-05-21 VITALS — BP 120/58 | HR 72 | Ht 65.0 in | Wt 180.0 lb

## 2023-05-21 DIAGNOSIS — E782 Mixed hyperlipidemia: Secondary | ICD-10-CM

## 2023-05-21 DIAGNOSIS — R011 Cardiac murmur, unspecified: Secondary | ICD-10-CM

## 2023-05-21 DIAGNOSIS — I2510 Atherosclerotic heart disease of native coronary artery without angina pectoris: Secondary | ICD-10-CM | POA: Diagnosis not present

## 2023-05-21 DIAGNOSIS — E785 Hyperlipidemia, unspecified: Secondary | ICD-10-CM | POA: Insufficient documentation

## 2023-05-21 DIAGNOSIS — I421 Obstructive hypertrophic cardiomyopathy: Secondary | ICD-10-CM

## 2023-05-21 DIAGNOSIS — R002 Palpitations: Secondary | ICD-10-CM | POA: Diagnosis not present

## 2023-05-21 MED ORDER — ROSUVASTATIN CALCIUM 10 MG PO TABS: 10.0000 mg | ORAL_TABLET | Freq: Every day | ORAL | 3 refills | Status: DC

## 2023-05-21 NOTE — Progress Notes (Signed)
05/21/2023 [name]   1945-10-06  [id number]  Primary Physician Soundra Pilon, FNP Primary Cardiologist: Runell Gess MD Milagros Loll, [place], [place]  HPI:  Christina Melendez is a 77 y.o.    moderately overweight married Caucasian female with no children referred by Dr. Susa Simmonds for cardiovascular evaluation because of new onset dyspnea.  I last saw her in the office 07/21/2018.  He is accompanied by her husband Max who is also my patient today.  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  She has no cardiovascular risk factors.  She walks approximately a mile a day.  There is no family history of heart disease.  She is never had a heart attack or stroke.  She has never smoked.  She is noticed increasing dyspnea the last 6 months especially when walking around the house.  She specifically denies chest pain.  She does have a soft outflow tract murmur by auscultation.   Since I saw her 5 years ago she has remained stable.  She did see Tereso Newcomer, PA-C in the office 2 months ago.  She had an echo performed as 27/24 that suggested moderate asymmetric septal hypertrophy without outflow tract gradient and mild to moderate pulmonary hypertension.  A Myoview stress test performed 02/28/2023 was low risk and nonischemic.  She did have an event monitor performed 11/17/2020 that showed frequent PACs and short runs of SVT.  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   Current Meds  Medication Sig   aspirin EC 81 MG tablet Take 81 mg by mouth daily. Swallow whole.   Calcium Carb-Cholecalciferol (CALCIUM + D3 PO) Take 2 tablets by mouth daily.   esomeprazole (NEXIUM) 20 MG capsule Take 20 mg by mouth daily.   estradiol (ESTRACE) 1 MG tablet Take 1 mg by mouth daily.   FLUoxetine (PROZAC) 20 MG capsule Take 20 mg by mouth daily.   furosemide (LASIX) 20 MG tablet Take 1 tablet (20 mg total) by mouth every  Monday, Wednesday, and Friday.   meclizine (ANTIVERT) 25 MG tablet Take 25 mg by mouth daily.   metoprolol tartrate (LOPRESSOR) 25 MG tablet Take 1 tablet (25 mg total) by mouth 2 (two) times daily.   ondansetron (ZOFRAN) 4 MG tablet Take 4 mg by mouth every 8 (eight) hours as needed for nausea or vomiting.   rosuvastatin (CRESTOR) 10 MG tablet Take 1 tablet (10 mg total) by mouth daily.   temazepam (RESTORIL) 30 MG capsule Take 30 mg by mouth at bedtime.   traMADol (ULTRAM) 50 MG tablet Take 50 mg by mouth 3 (three) times daily.     No Known Allergies  Social History   Socioeconomic History   Marital status: Married    Spouse name: Not on file   Number of children: Not on file   Years of education: Not on file   Highest education level: Not on file  Occupational History   Not on file  Tobacco Use   Smoking status: Never   Smokeless tobacco: Never  Vaping Use   Vaping status: Never Used  Substance and Sexual Activity   Alcohol use: Never   Drug use: No   Sexual activity: Not Currently  Other Topics Concern   Not on file  Social History Narrative   ** Merged History Encounter **    Right handed   Rarely drink caffine  Social Drivers of Corporate investment banker Strain: Not on file  Food Insecurity: Low Risk  (01/30/2023)   Received from [facility]   Hunger Vital Sign    Worried About Running Out of Food in the Last Year: Never true    Ran Out of Food in the Last Year: Never true  Transportation Needs: No Transportation Needs (01/30/2023)   Received from [facility]    In the past 12 months, has lack of reliable transportation kept you from medical appointments, meetings, work or from getting things needed for daily living? : No  Physical Activity: Not on file  Stress: Not on file  Social Connections: Not on file  Intimate Partner Violence: Not on file     Review of Systems: General: negative for chills, fever, night sweats or weight  changes.  Cardiovascular: negative for chest pain, dyspnea on exertion, edema, orthopnea, palpitations, paroxysmal nocturnal dyspnea or shortness of breath Dermatological: negative for rash Respiratory: negative for cough or wheezing Urologic: negative for hematuria Abdominal: negative for nausea, vomiting, diarrhea, bright red blood per rectum, melena, or hematemesis Neurologic: negative for visual changes, syncope, or dizziness All other systems reviewed and are otherwise negative except as noted above.    Blood pressure (!) 120/58, pulse 72, height 5\' 5"  (1.651 m), weight 180 lb (81.6 kg).  General appearance: alert and no distress Neck: no adenopathy, no carotid bruit, no JVD, supple, symmetrical, trachea midline, and thyroid not enlarged, symmetric, no tenderness/mass/nodules Lungs: clear to auscultation bilaterally Heart: 2/6 outflow tract murmur consistent with aortic valve disease or hypertrophic cardiomyopathy.. Extremities: extremities normal, atraumatic, no cyanosis or edema Pulses: 2+ and symmetric Skin: Skin color, texture, turgor normal. No rashes or lesions Neurologic: Grossly normal  EKG not performed today      ASSESSMENT AND PLAN:   Cardiac murmur Outflow tract murmur consistent with hypertrophic cardiomyopathy with an outflow tract gradient.  Coronary artery disease involving native coronary artery of native heart without angina pectoris Coronary CTA performed 12/30/2019 revealed coronary calcium score 27 with nonobstructive CAD.  Myoview performed 02/28/2023 was nonischemic and low risk.  Palpitations History of palpitations in the past with event monitor that showed frequent PACs and short runs of SVT.  This does not seem to be a problem currently.  HOCM (hypertrophic obstructive cardiomyopathy) (HCC) 2D echo performed 02/28/2023 revealed normal LV systolic function with moderate asymmetric basal septal hypertrophy with a gradient of 18 mmHg.  There was other  moderate pulmonary hypertension with an RV systolic pressure of 43 and no other structural findings.  Based on this, I am referring her to Dr.Chandrasekhar for further evaluation and possibly a cardiac MRI.  Hyperlipidemia History of hyperlipidemia on rosuvastatin 10 mg a day with lipid profile performed 04/17/2022 revealed a total cholesterol of 168, LDL of 88 and HDL 47.  Her coronary calcium score is mildly elevated.  She is not at goal for secondary prevention.  Since this was just started in October we will recheck a lipid liver profile and see if she is at goal.     Runell Gess MD [facility], [facility] 05/21/2023 10:40 AM

## 2023-05-21 NOTE — Assessment & Plan Note (Signed)
2D echo performed 02/28/2023 revealed normal LV systolic function with moderate asymmetric basal septal hypertrophy with a gradient of 18 mmHg.  There was other moderate pulmonary hypertension with an RV systolic pressure of 43 and no other structural findings.  Based on this, I am referring her to Dr.Chandrasekhar for further evaluation and possibly a cardiac MRI.

## 2023-05-21 NOTE — Assessment & Plan Note (Signed)
Outflow tract murmur consistent with hypertrophic cardiomyopathy with an outflow tract gradient.

## 2023-05-21 NOTE — Assessment & Plan Note (Signed)
History of palpitations in the past with event monitor that showed frequent PACs and short runs of SVT.  This does not seem to be a problem currently.

## 2023-05-21 NOTE — Patient Instructions (Addendum)
Medication Instructions:  Your physician recommends that you continue on your current medications as directed. Please refer to the Current Medication list given to you today.   *If you need a refill on your cardiac medications before your next appointment, please call your pharmacy*   Lab Work: Your physician recommends that you return for lab work in: the next week or 2 for FASTING lipid/liver panel  If you have labs (blood work) drawn today and your tests are completely normal, you will receive your results only by: MyChart Message (if you have MyChart) OR A paper copy in the mail If you have any lab test that is abnormal or we need to change your treatment, we will call you to review the results.   Follow-Up: At [facility], you and your health needs are our priority.  As part of our continuing mission to provide you with exceptional heart care, we have created designated Provider Care Teams.  These Care Teams include your primary Cardiologist (physician) and Advanced Practice Providers (APPs -  Physician Assistants and Nurse Practitioners) who all work together to provide you with the care you need, when you need it.  We recommend signing up for the patient portal called "MyChart".  Sign up information is provided on this After Visit Summary.  MyChart is used to connect with patients for Virtual Visits (Telemedicine).  Patients are able to view lab/test results, encounter notes, upcoming appointments, etc.  Non-urgent messages can be sent to your provider as well.   To learn more about what you can do with MyChart, go to [email or web address].    Your next appointment:   6 month(s)  Provider:   Tereso Newcomer, PA-C        Then, Nanetta Batty, MD will plan to see you again in 12 month(s).

## 2023-05-21 NOTE — Addendum Note (Signed)
Addended by: Bernita Buffy on: 05/21/2023 10:44 AM   Modules accepted: Orders

## 2023-05-21 NOTE — Assessment & Plan Note (Signed)
Coronary CTA performed 12/30/2019 revealed coronary calcium score 27 with nonobstructive CAD.  Myoview performed 02/28/2023 was nonischemic and low risk.

## 2023-05-21 NOTE — Assessment & Plan Note (Addendum)
History of hyperlipidemia on rosuvastatin 10 mg a day with lipid profile performed 04/17/2022 revealed a total cholesterol of 168, LDL of 88 and HDL 47.  Her coronary calcium score is mildly elevated.  She is not at goal for secondary prevention.  Since this was just started in October we will recheck a lipid liver profile and see if she is at goal.

## 2023-05-23 DIAGNOSIS — I2510 Atherosclerotic heart disease of native coronary artery without angina pectoris: Secondary | ICD-10-CM | POA: Diagnosis not present

## 2023-05-23 DIAGNOSIS — E782 Mixed hyperlipidemia: Secondary | ICD-10-CM | POA: Diagnosis not present

## 2023-05-23 LAB — HEPATIC FUNCTION PANEL
ALT: 14 [IU]/L (ref 0–32)
AST: 22 [IU]/L (ref 0–40)
Albumin: 3.9 g/dL (ref 3.8–4.8)
Alkaline Phosphatase: 99 [IU]/L (ref 44–121)
Bilirubin Total: 0.4 mg/dL (ref 0.0–1.2)
Bilirubin, Direct: 0.13 mg/dL (ref 0.00–0.40)
Total Protein: 5.8 g/dL — ABNORMAL LOW (ref 6.0–8.5)

## 2023-05-23 LAB — LIPID PANEL
Chol/HDL Ratio: 3.1 {ratio} (ref 0.0–4.4)
Cholesterol, Total: 135 mg/dL (ref 100–199)
HDL: 44 mg/dL (ref >39)
LDL Chol Calc (NIH): 59 mg/dL (ref 0–99)
Triglycerides: 197 mg/dL — ABNORMAL HIGH (ref 0–149)
VLDL Cholesterol Cal: 32 mg/dL (ref 5–40)

## 2023-05-26 ENCOUNTER — Other Ambulatory Visit: Payer: Medicare HMO

## 2023-06-02 ENCOUNTER — Other Ambulatory Visit: Payer: Self-pay | Admitting: Family Medicine

## 2023-06-02 DIAGNOSIS — Z1231 Encounter for screening mammogram for malignant neoplasm of breast: Secondary | ICD-10-CM

## 2023-06-13 ENCOUNTER — Other Ambulatory Visit: Payer: Medicare HMO

## 2023-06-13 DIAGNOSIS — I5032 Chronic diastolic (congestive) heart failure: Secondary | ICD-10-CM

## 2023-06-13 DIAGNOSIS — I2510 Atherosclerotic heart disease of native coronary artery without angina pectoris: Secondary | ICD-10-CM

## 2023-06-13 DIAGNOSIS — I421 Obstructive hypertrophic cardiomyopathy: Secondary | ICD-10-CM

## 2023-06-13 DIAGNOSIS — I272 Pulmonary hypertension, unspecified: Secondary | ICD-10-CM

## 2023-06-16 DIAGNOSIS — M5416 Radiculopathy, lumbar region: Secondary | ICD-10-CM | POA: Diagnosis not present

## 2023-06-16 DIAGNOSIS — Z79899 Other long term (current) drug therapy: Secondary | ICD-10-CM | POA: Diagnosis not present

## 2023-06-16 DIAGNOSIS — G894 Chronic pain syndrome: Secondary | ICD-10-CM | POA: Diagnosis not present

## 2023-06-16 DIAGNOSIS — Z79891 Long term (current) use of opiate analgesic: Secondary | ICD-10-CM | POA: Diagnosis not present

## 2023-06-16 DIAGNOSIS — Z5181 Encounter for therapeutic drug level monitoring: Secondary | ICD-10-CM | POA: Diagnosis not present

## 2023-06-16 DIAGNOSIS — M51362 Other intervertebral disc degeneration, lumbar region with discogenic back pain and lower extremity pain: Secondary | ICD-10-CM | POA: Diagnosis not present

## 2023-06-19 DIAGNOSIS — Z760 Encounter for issue of repeat prescription: Secondary | ICD-10-CM | POA: Diagnosis not present

## 2023-06-19 DIAGNOSIS — R232 Flushing: Secondary | ICD-10-CM | POA: Diagnosis not present

## 2023-06-19 NOTE — Progress Notes (Signed)
 Subjective Patient ID: Christina  Lanett Melendez is a 78 y.o. female.  Chief Complaint  Patient presents with  . Medication Refill    The following information was reviewed by members of the visit team:  Tobacco  Allergies  Meds  Med Hx  Surg Hx  OB Status  Fam Hx  Soc  Hx     Pt is a 78 year old presenting for medication refill.     Pt states that she is doing well. Spouse is at bedside. Desires Estrace  refill for her hot flashes. Has tried to stop pills in the past but it made her feel sick and her quality of life is diminished. Traveled from [place]. Denies any other concerns or needs.   PSH: total hysterectomy in 1980s LMP: Postmenopausal Sexually active: yes Family cancer history:  No pertinent GYN history of: breast, colon, uterus, fallopian tube, or cervical cancer in her family. Abuse: No concerns reported Depression: No concerns reported Pap hx: no abnormals, lasts pap in 2017     Review of Systems  Constitutional: Negative.   HENT: Negative.    Eyes: Negative.   Respiratory: Negative.    Cardiovascular: Negative.   Gastrointestinal: Negative.   Endocrine: Negative.   Genitourinary:        Hot flashes  Musculoskeletal: Negative.   Skin: Negative.   Allergic/Immunologic: Negative.   Neurological: Negative.   Hematological: Negative.   Psychiatric/Behavioral: Negative.      Objective Physical Exam Vitals reviewed.  Constitutional:      Appearance: Normal appearance.  HENT:     Head: Normocephalic and atraumatic.  Eyes:     Conjunctiva/sclera: Conjunctivae normal.     Pupils: Pupils are equal, round, and reactive to light.  Genitourinary:    Comments: Deferred Musculoskeletal:        General: Normal range of motion.  Skin:    General: Skin is warm and dry.  Neurological:     Mental Status: She is alert and oriented to person, place, and time.  Psychiatric:        Mood and Affect: Mood normal.        Behavior: Behavior normal.      Assessment/Plan Diagnoses and all orders for this visit:  Encounter for medication refill Hot flashes - Mammogram due. Screening ordered.  - Estradiol  refilled, discussed with pt the need to lower dosage in the setting of cardiac history  - Pt should take a daily vitamin, exercise, and incorporate a well balanced diet into her routine - Instructed pt to perform monthly self breast exams - Return next year or follow up prn -     [facility] Breast Screening Tomo Bilat; Future   Other orders -     FLUoxetine  (PROzac ) 20 mg capsule; Take 1 capsule by mouth daily. -     furosemide  (LASIX ) 20 mg tablet; Take 20 mg by mouth. -     rosuvastatin  (CRESTOR ) 10 mg tablet; Take 10 mg by mouth daily. -     estradioL  (ESTRACE ) 0.5 mg tablet; Take 1 tablet (0.5 mg total) by mouth daily.   The Women's Health Initiative Study was explained to the patient.  The risks and benefits of HRT were thoroughly reviewed, explained, understood and accepted, including but not limited to: the possible increased risks of heart attack, stroke, blood clots, breast cancer, and Alzheimer's and other untoward side effects and complications.  She understands and accepts the risks and benefits and would like to proceed with low dose therapy. All questions have  been answered to her satisfaction.  She knows that we are available at any time for any questions that may arise. Patient understands and agrees with plan.     Electronically signed: Vanna Rojean Harm, NP 06/19/2023  11:48 AM

## 2023-07-01 ENCOUNTER — Ambulatory Visit
Admission: RE | Admit: 2023-07-01 | Discharge: 2023-07-01 | Disposition: A | Discharge status: Home / Self Care | Payer: Medicare HMO | Source: Ambulatory Visit | Attending: Family Medicine | Admitting: Family Medicine

## 2023-07-01 DIAGNOSIS — Z1231 Encounter for screening mammogram for malignant neoplasm of breast: Secondary | ICD-10-CM | POA: Diagnosis not present

## 2023-07-03 ENCOUNTER — Other Ambulatory Visit: Payer: Self-pay | Admitting: Family Medicine

## 2023-07-03 DIAGNOSIS — R928 Other abnormal and inconclusive findings on diagnostic imaging of breast: Secondary | ICD-10-CM

## 2023-07-29 ENCOUNTER — Other Ambulatory Visit: Payer: Medicare HMO

## 2023-07-31 DIAGNOSIS — H6123 Impacted cerumen, bilateral: Secondary | ICD-10-CM | POA: Diagnosis not present

## 2023-07-31 NOTE — Progress Notes (Signed)
 Procedure Note - Removal of Cerumen Impaction, Bilateral:  Cc: 31-month return visit for routine ear cleaning.   Risks/benefits and alternatives were discussed with patient who understands and agrees to proceed.  DETAILS OF PROCEDURE: The patient was positioned and the ear was examined with the microscope. Obstructing cerumen was gently removed using curette from both ear canals. Tms are fully visualized, normal and intact, middle ears aerated. No signs of infection or cholesteatoma.   The patient tolerated this well.  No complications.  Follow up: 6 months.

## 2023-08-01 ENCOUNTER — Ambulatory Visit
Admission: RE | Admit: 2023-08-01 | Discharge: 2023-08-01 | Disposition: A | Discharge status: Home / Self Care | Payer: Medicare HMO | Source: Ambulatory Visit | Attending: Family Medicine | Admitting: Family Medicine

## 2023-08-01 DIAGNOSIS — N6313 Unspecified lump in the right breast, lower outer quadrant: Secondary | ICD-10-CM | POA: Diagnosis not present

## 2023-08-01 DIAGNOSIS — R928 Other abnormal and inconclusive findings on diagnostic imaging of breast: Secondary | ICD-10-CM

## 2023-08-05 ENCOUNTER — Encounter: Payer: Self-pay | Admitting: Internal Medicine

## 2023-09-09 DIAGNOSIS — Z79891 Long term (current) use of opiate analgesic: Secondary | ICD-10-CM | POA: Diagnosis not present

## 2023-09-09 DIAGNOSIS — M51362 Other intervertebral disc degeneration, lumbar region with discogenic back pain and lower extremity pain: Secondary | ICD-10-CM | POA: Diagnosis not present

## 2023-09-09 DIAGNOSIS — R296 Repeated falls: Secondary | ICD-10-CM | POA: Diagnosis not present

## 2023-09-09 DIAGNOSIS — M5416 Radiculopathy, lumbar region: Secondary | ICD-10-CM | POA: Diagnosis not present

## 2023-09-09 DIAGNOSIS — M25561 Pain in right knee: Secondary | ICD-10-CM | POA: Diagnosis not present

## 2023-09-09 DIAGNOSIS — G894 Chronic pain syndrome: Secondary | ICD-10-CM | POA: Diagnosis not present

## 2023-10-16 DIAGNOSIS — Z6830 Body mass index (BMI) 30.0-30.9, adult: Secondary | ICD-10-CM | POA: Diagnosis not present

## 2023-10-16 DIAGNOSIS — R7303 Prediabetes: Secondary | ICD-10-CM | POA: Diagnosis not present

## 2023-10-16 DIAGNOSIS — G4700 Insomnia, unspecified: Secondary | ICD-10-CM | POA: Diagnosis not present

## 2023-10-16 DIAGNOSIS — M810 Age-related osteoporosis without current pathological fracture: Secondary | ICD-10-CM | POA: Diagnosis not present

## 2023-10-16 DIAGNOSIS — I272 Pulmonary hypertension, unspecified: Secondary | ICD-10-CM | POA: Diagnosis not present

## 2023-10-16 DIAGNOSIS — F3342 Major depressive disorder, recurrent, in full remission: Secondary | ICD-10-CM | POA: Diagnosis not present

## 2023-10-16 DIAGNOSIS — E559 Vitamin D deficiency, unspecified: Secondary | ICD-10-CM | POA: Diagnosis not present

## 2023-10-16 DIAGNOSIS — E782 Mixed hyperlipidemia: Secondary | ICD-10-CM | POA: Diagnosis not present

## 2023-11-01 DIAGNOSIS — M810 Age-related osteoporosis without current pathological fracture: Secondary | ICD-10-CM | POA: Diagnosis not present

## 2023-11-01 DIAGNOSIS — E782 Mixed hyperlipidemia: Secondary | ICD-10-CM | POA: Diagnosis not present

## 2023-11-01 DIAGNOSIS — I2510 Atherosclerotic heart disease of native coronary artery without angina pectoris: Secondary | ICD-10-CM | POA: Diagnosis not present

## 2023-11-01 DIAGNOSIS — F3342 Major depressive disorder, recurrent, in full remission: Secondary | ICD-10-CM | POA: Diagnosis not present

## 2023-11-17 ENCOUNTER — Encounter: Payer: Self-pay | Admitting: Physician Assistant

## 2023-11-24 DIAGNOSIS — M51362 Other intervertebral disc degeneration, lumbar region with discogenic back pain and lower extremity pain: Secondary | ICD-10-CM | POA: Diagnosis not present

## 2023-11-24 DIAGNOSIS — M5416 Radiculopathy, lumbar region: Secondary | ICD-10-CM | POA: Diagnosis not present

## 2023-12-17 DIAGNOSIS — R350 Frequency of micturition: Secondary | ICD-10-CM | POA: Diagnosis not present

## 2023-12-17 DIAGNOSIS — E782 Mixed hyperlipidemia: Secondary | ICD-10-CM | POA: Diagnosis not present

## 2023-12-17 DIAGNOSIS — M810 Age-related osteoporosis without current pathological fracture: Secondary | ICD-10-CM | POA: Diagnosis not present

## 2023-12-17 DIAGNOSIS — G4700 Insomnia, unspecified: Secondary | ICD-10-CM | POA: Diagnosis not present

## 2023-12-17 DIAGNOSIS — F3342 Major depressive disorder, recurrent, in full remission: Secondary | ICD-10-CM | POA: Diagnosis not present

## 2023-12-31 ENCOUNTER — Inpatient Hospital Stay (HOSPITAL_COMMUNITY)
Admission: EM | Admit: 2023-12-31 | Discharge: 2024-01-03 | DRG: 871 | Disposition: A | Discharge status: Home / Self Care | Attending: Internal Medicine | Admitting: Internal Medicine

## 2023-12-31 ENCOUNTER — Emergency Department (HOSPITAL_COMMUNITY)

## 2023-12-31 DIAGNOSIS — R319 Hematuria, unspecified: Secondary | ICD-10-CM

## 2023-12-31 DIAGNOSIS — Z96652 Presence of left artificial knee joint: Secondary | ICD-10-CM | POA: Diagnosis present

## 2023-12-31 DIAGNOSIS — I2510 Atherosclerotic heart disease of native coronary artery without angina pectoris: Secondary | ICD-10-CM | POA: Diagnosis present

## 2023-12-31 DIAGNOSIS — I130 Hypertensive heart and chronic kidney disease with heart failure and stage 1 through stage 4 chronic kidney disease, or unspecified chronic kidney disease: Secondary | ICD-10-CM | POA: Diagnosis present

## 2023-12-31 DIAGNOSIS — I6523 Occlusion and stenosis of bilateral carotid arteries: Secondary | ICD-10-CM | POA: Diagnosis not present

## 2023-12-31 DIAGNOSIS — J189 Pneumonia, unspecified organism: Secondary | ICD-10-CM | POA: Diagnosis not present

## 2023-12-31 DIAGNOSIS — R0989 Other specified symptoms and signs involving the circulatory and respiratory systems: Secondary | ICD-10-CM | POA: Diagnosis not present

## 2023-12-31 DIAGNOSIS — A419 Sepsis, unspecified organism: Secondary | ICD-10-CM

## 2023-12-31 DIAGNOSIS — N1831 Chronic kidney disease, stage 3a: Secondary | ICD-10-CM | POA: Diagnosis present

## 2023-12-31 DIAGNOSIS — I6613 Occlusion and stenosis of bilateral anterior cerebral arteries: Secondary | ICD-10-CM | POA: Diagnosis present

## 2023-12-31 DIAGNOSIS — E785 Hyperlipidemia, unspecified: Secondary | ICD-10-CM | POA: Diagnosis present

## 2023-12-31 DIAGNOSIS — I421 Obstructive hypertrophic cardiomyopathy: Secondary | ICD-10-CM | POA: Diagnosis present

## 2023-12-31 DIAGNOSIS — Z90710 Acquired absence of both cervix and uterus: Secondary | ICD-10-CM | POA: Diagnosis not present

## 2023-12-31 DIAGNOSIS — Z1152 Encounter for screening for COVID-19: Secondary | ICD-10-CM

## 2023-12-31 DIAGNOSIS — R918 Other nonspecific abnormal finding of lung field: Secondary | ICD-10-CM | POA: Diagnosis not present

## 2023-12-31 DIAGNOSIS — I5032 Chronic diastolic (congestive) heart failure: Secondary | ICD-10-CM | POA: Diagnosis present

## 2023-12-31 DIAGNOSIS — E860 Dehydration: Secondary | ICD-10-CM | POA: Diagnosis present

## 2023-12-31 DIAGNOSIS — R000 Tachycardia, unspecified: Secondary | ICD-10-CM | POA: Diagnosis not present

## 2023-12-31 DIAGNOSIS — F419 Anxiety disorder, unspecified: Secondary | ICD-10-CM | POA: Diagnosis present

## 2023-12-31 DIAGNOSIS — N3281 Overactive bladder: Secondary | ICD-10-CM | POA: Diagnosis not present

## 2023-12-31 DIAGNOSIS — R531 Weakness: Secondary | ICD-10-CM

## 2023-12-31 DIAGNOSIS — R0902 Hypoxemia: Secondary | ICD-10-CM | POA: Diagnosis not present

## 2023-12-31 DIAGNOSIS — E871 Hypo-osmolality and hyponatremia: Secondary | ICD-10-CM | POA: Diagnosis present

## 2023-12-31 DIAGNOSIS — E872 Acidosis, unspecified: Secondary | ICD-10-CM | POA: Diagnosis present

## 2023-12-31 DIAGNOSIS — R4182 Altered mental status, unspecified: Secondary | ICD-10-CM | POA: Diagnosis not present

## 2023-12-31 DIAGNOSIS — G9340 Encephalopathy, unspecified: Secondary | ICD-10-CM

## 2023-12-31 DIAGNOSIS — R0689 Other abnormalities of breathing: Secondary | ICD-10-CM | POA: Diagnosis not present

## 2023-12-31 DIAGNOSIS — G9341 Metabolic encephalopathy: Secondary | ICD-10-CM | POA: Diagnosis not present

## 2023-12-31 DIAGNOSIS — Z79899 Other long term (current) drug therapy: Secondary | ICD-10-CM

## 2023-12-31 DIAGNOSIS — K219 Gastro-esophageal reflux disease without esophagitis: Secondary | ICD-10-CM | POA: Diagnosis present

## 2023-12-31 DIAGNOSIS — I21A1 Myocardial infarction type 2: Secondary | ICD-10-CM | POA: Diagnosis not present

## 2023-12-31 DIAGNOSIS — R29818 Other symptoms and signs involving the nervous system: Secondary | ICD-10-CM | POA: Diagnosis not present

## 2023-12-31 DIAGNOSIS — R3129 Other microscopic hematuria: Secondary | ICD-10-CM | POA: Diagnosis present

## 2023-12-31 DIAGNOSIS — R0602 Shortness of breath: Secondary | ICD-10-CM | POA: Diagnosis not present

## 2023-12-31 DIAGNOSIS — A4151 Sepsis due to Escherichia coli [E. coli]: Principal | ICD-10-CM | POA: Diagnosis present

## 2023-12-31 DIAGNOSIS — R6520 Severe sepsis without septic shock: Secondary | ICD-10-CM | POA: Diagnosis present

## 2023-12-31 DIAGNOSIS — Z7982 Long term (current) use of aspirin: Secondary | ICD-10-CM

## 2023-12-31 DIAGNOSIS — R2990 Unspecified symptoms and signs involving the nervous system: Secondary | ICD-10-CM

## 2023-12-31 DIAGNOSIS — F32A Depression, unspecified: Secondary | ICD-10-CM | POA: Diagnosis present

## 2023-12-31 DIAGNOSIS — N390 Urinary tract infection, site not specified: Secondary | ICD-10-CM | POA: Diagnosis present

## 2023-12-31 DIAGNOSIS — R569 Unspecified convulsions: Secondary | ICD-10-CM

## 2023-12-31 DIAGNOSIS — Z803 Family history of malignant neoplasm of breast: Secondary | ICD-10-CM

## 2023-12-31 DIAGNOSIS — J9601 Acute respiratory failure with hypoxia: Secondary | ICD-10-CM | POA: Diagnosis not present

## 2023-12-31 DIAGNOSIS — N179 Acute kidney failure, unspecified: Secondary | ICD-10-CM | POA: Diagnosis present

## 2023-12-31 DIAGNOSIS — R404 Transient alteration of awareness: Secondary | ICD-10-CM | POA: Diagnosis not present

## 2023-12-31 DIAGNOSIS — I6782 Cerebral ischemia: Secondary | ICD-10-CM | POA: Diagnosis not present

## 2023-12-31 DIAGNOSIS — R509 Fever, unspecified: Secondary | ICD-10-CM | POA: Diagnosis not present

## 2023-12-31 DIAGNOSIS — J168 Pneumonia due to other specified infectious organisms: Secondary | ICD-10-CM | POA: Diagnosis not present

## 2023-12-31 LAB — COMPREHENSIVE METABOLIC PANEL WITH GFR
ALT: 16 U/L (ref 0–44)
AST: 31 U/L (ref 15–41)
Albumin: 3.2 g/dL — ABNORMAL LOW (ref 3.5–5.0)
Alkaline Phosphatase: 103 U/L (ref 38–126)
Anion gap: 12 (ref 5–15)
BUN: 15 mg/dL (ref 8–23)
CO2: 23 mmol/L (ref 22–32)
Calcium: 10.2 mg/dL (ref 8.9–10.3)
Chloride: 99 mmol/L (ref 98–111)
Creatinine, Ser: 1.09 mg/dL — ABNORMAL HIGH (ref 0.44–1.00)
GFR, Estimated: 52 mL/min — ABNORMAL LOW (ref >60)
Glucose, Bld: 185 mg/dL — ABNORMAL HIGH (ref 70–99)
Potassium: 3.8 mmol/L (ref 3.5–5.1)
Sodium: 134 mmol/L — ABNORMAL LOW (ref 135–145)
Total Bilirubin: 1.5 mg/dL — ABNORMAL HIGH (ref 0.0–1.2)
Total Protein: 6.3 g/dL — ABNORMAL LOW (ref 6.5–8.1)

## 2023-12-31 LAB — CBC
HCT: 44.3 % (ref 36.0–46.0)
Hemoglobin: 15.1 g/dL — ABNORMAL HIGH (ref 12.0–15.0)
MCH: 31.9 pg (ref 26.0–34.0)
MCHC: 34.1 g/dL (ref 30.0–36.0)
MCV: 93.7 fL (ref 80.0–100.0)
Platelets: 157 K/uL (ref 150–400)
RBC: 4.73 MIL/uL (ref 3.87–5.11)
RDW: 12.3 % (ref 11.5–15.5)
WBC: 9.3 K/uL (ref 4.0–10.5)
nRBC: 0 % (ref 0.0–0.2)

## 2023-12-31 LAB — RESP PANEL BY RT-PCR (RSV, FLU A&B, COVID)  RVPGX2
Influenza A by PCR: NEGATIVE
Influenza B by PCR: NEGATIVE
Resp Syncytial Virus by PCR: NEGATIVE
SARS Coronavirus 2 by RT PCR: NEGATIVE

## 2023-12-31 LAB — I-STAT CHEM 8, ED
BUN: 17 mg/dL (ref 8–23)
Calcium, Ion: 1.26 mmol/L (ref 1.15–1.40)
Chloride: 100 mmol/L (ref 98–111)
Creatinine, Ser: 1 mg/dL (ref 0.44–1.00)
Glucose, Bld: 192 mg/dL — ABNORMAL HIGH (ref 70–99)
HCT: 46 % (ref 36.0–46.0)
Hemoglobin: 15.6 g/dL — ABNORMAL HIGH (ref 12.0–15.0)
Potassium: 3.8 mmol/L (ref 3.5–5.1)
Sodium: 134 mmol/L — ABNORMAL LOW (ref 135–145)
TCO2: 23 mmol/L (ref 22–32)

## 2023-12-31 LAB — URINALYSIS, W/ REFLEX TO CULTURE (INFECTION SUSPECTED)
Bilirubin Urine: NEGATIVE
Glucose, UA: NEGATIVE mg/dL
Ketones, ur: 5 mg/dL — AB
Nitrite: NEGATIVE
Protein, ur: 100 mg/dL — AB
RBC / HPF: >50 RBC/hpf (ref 0–5)
Specific Gravity, Urine: 1.026 (ref 1.005–1.030)
pH: 6 (ref 5.0–8.0)

## 2023-12-31 LAB — DIFFERENTIAL
Abs Immature Granulocytes: 0.03 K/uL (ref 0.00–0.07)
Basophils Absolute: 0 K/uL (ref 0.0–0.1)
Basophils Relative: 0 %
Eosinophils Absolute: 0 K/uL (ref 0.0–0.5)
Eosinophils Relative: 0 %
Immature Granulocytes: 0 %
Lymphocytes Relative: 6 %
Lymphs Abs: 0.6 K/uL — ABNORMAL LOW (ref 0.7–4.0)
Monocytes Absolute: 0.2 K/uL (ref 0.1–1.0)
Monocytes Relative: 2 %
Neutro Abs: 8.5 K/uL — ABNORMAL HIGH (ref 1.7–7.7)
Neutrophils Relative %: 92 %

## 2023-12-31 LAB — RAPID URINE DRUG SCREEN, HOSP PERFORMED
Amphetamines: NOT DETECTED
Barbiturates: NOT DETECTED
Benzodiazepines: POSITIVE — AB
Cocaine: NOT DETECTED
Opiates: NOT DETECTED
Tetrahydrocannabinol: NOT DETECTED

## 2023-12-31 LAB — I-STAT CG4 LACTIC ACID, ED: Lactic Acid, Venous: 2.3 mmol/L (ref 0.5–1.9)

## 2023-12-31 LAB — TROPONIN I (HIGH SENSITIVITY): Troponin I (High Sensitivity): 23 ng/L — ABNORMAL HIGH (ref <18)

## 2023-12-31 LAB — PROTIME-INR
INR: 1.1 (ref 0.8–1.2)
Prothrombin Time: 14.6 s (ref 11.4–15.2)

## 2023-12-31 LAB — TSH: TSH: 1.481 u[IU]/mL (ref 0.350–4.500)

## 2023-12-31 LAB — ETHANOL: Alcohol, Ethyl (B): <15 mg/dL (ref <15)

## 2023-12-31 LAB — APTT: aPTT: 31 s (ref 24–36)

## 2023-12-31 LAB — CBG MONITORING, ED: Glucose-Capillary: 202 mg/dL — ABNORMAL HIGH (ref 70–99)

## 2023-12-31 LAB — T4, FREE: Free T4: 1.34 ng/dL — ABNORMAL HIGH (ref 0.61–1.12)

## 2023-12-31 MED ORDER — METRONIDAZOLE 500 MG/100ML IV SOLN
500.0000 mg | Freq: Once | INTRAVENOUS | Status: AC
  Administered 2023-12-31: 500 mg via INTRAVENOUS
  Filled 2023-12-31: qty 100

## 2023-12-31 MED ORDER — VANCOMYCIN HCL 1500 MG/300ML IV SOLN
1500.0000 mg | Freq: Once | INTRAVENOUS | Status: AC
  Administered 2023-12-31: 1500 mg via INTRAVENOUS
  Filled 2023-12-31: qty 300

## 2023-12-31 MED ORDER — SODIUM CHLORIDE 0.9 % IV SOLN
2.0000 g | Freq: Once | INTRAVENOUS | Status: AC
  Administered 2023-12-31: 2 g via INTRAVENOUS
  Filled 2023-12-31: qty 12.5

## 2023-12-31 MED ORDER — IOHEXOL 350 MG/ML SOLN
60.0000 mL | Freq: Once | INTRAVENOUS | Status: AC | PRN
  Administered 2023-12-31: 60 mL via INTRAVENOUS

## 2023-12-31 MED ORDER — VANCOMYCIN HCL IN DEXTROSE 1-5 GM/200ML-% IV SOLN: 1000.0000 mg | Freq: Once | INTRAVENOUS | Status: DC

## 2023-12-31 MED ORDER — ACETAMINOPHEN 650 MG RE SUPP
650.0000 mg | RECTAL | Status: AC
  Administered 2023-12-31: 650 mg via RECTAL
  Filled 2023-12-31: qty 1

## 2023-12-31 NOTE — ED Triage Notes (Signed)
 Pt bib [facility] from home after her husband noticed her to be confused. She was taken to her PCP today at 1530 for confusion and went home to lay on the couch to go to bed at 1700. Woke up at 2030 with a call to EMS for increased confusion, aphasia and right sided weakness.  CODE Stroke called prior to pt arrival. Pt arrives unable to move right side and aphasia. And on 3LNC due to desat with EMS Pt started to move right side while in CT.

## 2023-12-31 NOTE — ED Provider Notes (Signed)
 [place] EMERGENCY DEPARTMENT AT [facility] Provider Note   CSN: [id number] Arrival date & time: 12/31/23  2201     Patient presents with: Code Stroke   Christina Melendez is a 78 y.o. female.   78 year old female with history of hyperparathyroidism, hyperlipidemia, and CKD who presents emergency department with altered mental status and weakness.  History obtained per EMS and the patient's husband.  Patient was taken to urgent care earlier today for urinary frequency and some confusion.  Got home and took a nap at 330pm.  Per EMS, at 8:30 PM became very altered.  When they arrived she was not moving her right side of her body.  Code stroke was activated in the field and she was brought in.  She was also hypoxic put on nasal cannula.       Prior to Admission medications   Medication Sig Start Date End Date Taking? Authorizing Provider  aspirin  EC 81 MG tablet Take 81 mg by mouth daily. Swallow whole.   Yes [provider]  Calcium  Carb-Cholecalciferol (CALCIUM  + D3 PO) Take 1 tablet by mouth in the morning and at bedtime.   Yes [provider]  esomeprazole (NEXIUM) 20 MG capsule Take 20 mg by mouth daily.   Yes [provider]  estradiol  (ESTRACE ) 0.5 MG tablet Take 0.5 mg by mouth daily.   Yes [provider]  FLUoxetine  (PROZAC ) 20 MG capsule Take 20 mg by mouth daily. 03/19/18  Yes [provider]  meclizine  (ANTIVERT ) 25 MG tablet Take 50 mg by mouth 2 (two) times daily. 11/16/20  Yes [provider]  metoprolol  tartrate (LOPRESSOR ) 25 MG tablet Take 1 tablet (25 mg total) by mouth 2 (two) times daily. 03/11/23 12/30/24 Yes Weaver, Scott T, PA-C  ondansetron  (ZOFRAN ) 4 MG tablet Take 4 mg by mouth every 8 (eight) hours as needed for nausea or vomiting.   Yes [provider]  rosuvastatin  (CRESTOR ) 10 MG tablet Take 1 tablet (10 mg total) by mouth daily. 05/21/23 12/30/24 Yes Court Dorn PARAS, MD  temazepam  (RESTORIL )  30 MG capsule Take 30 mg by mouth at bedtime.   Yes [provider]  traMADol (ULTRAM) 50 MG tablet Take 50-100 mg by mouth See admin instructions. Take 2 tablets by mouth in the morning and 1 tablet at night   Yes [provider]  furosemide  (LASIX ) 20 MG tablet Take 1 tablet (20 mg total) by mouth every Monday, Wednesday, and Friday. 03/14/23 12/30/24  Lelon Hamilton T, PA-C    Allergies: Patient has no known allergies.    Review of Systems  Updated Vital Signs BP (!) 158/77   Pulse (!) 109   Temp (!) 104.6 F (40.3 C) (Rectal)   Resp (!) 35   SpO2 96%   Physical Exam Vitals and nursing note reviewed.  Constitutional:      General: She is not in acute distress.    Appearance: She is well-developed.     Comments: Drowsy.  Will respond to certain questions and state her name.  HENT:     Head: Normocephalic and atraumatic.     Right Ear: External ear normal.     Left Ear: External ear normal.     Nose: Nose normal.  Eyes:     Extraocular Movements: Extraocular movements intact.     Conjunctiva/sclera: Conjunctivae normal.     Pupils: Pupils are equal, round, and reactive to light.  Cardiovascular:     Rate and Rhythm: Normal rate  and regular rhythm.     Heart sounds: No murmur heard. Pulmonary:     Effort: Pulmonary effort is normal. No respiratory distress.     Breath sounds: Normal breath sounds.  Abdominal:     General: There is no distension.     Palpations: There is no mass.     Tenderness: There is no abdominal tenderness. There is no guarding.  Musculoskeletal:     Cervical back: Normal range of motion and neck supple.     Right lower leg: No edema.     Left lower leg: No edema.  Skin:    General: Skin is warm and dry.  Neurological:     Comments: CN grossly intact. Decreased movement of RLE. Moving all other extremities equally.  Does not comply with full neuroexam due to confusion.  Psychiatric:        Mood and Affect: Mood normal.      (all labs ordered are listed, but only abnormal results are displayed) Labs Reviewed  CBC - Abnormal; Notable for the following components:      Result Value   Hemoglobin 15.1 (*)    All other components within normal limits  DIFFERENTIAL - Abnormal; Notable for the following components:   Neutro Abs 8.5 (*)    Lymphs Abs 0.6 (*)    All other components within normal limits  COMPREHENSIVE METABOLIC PANEL WITH GFR - Abnormal; Notable for the following components:   Sodium 134 (*)    Glucose, Bld 185 (*)    Creatinine, Ser 1.09 (*)    Total Protein 6.3 (*)    Albumin  3.2 (*)    Total Bilirubin 1.5 (*)    GFR, Estimated 52 (*)    All other components within normal limits  RAPID URINE DRUG SCREEN, HOSP PERFORMED - Abnormal; Notable for the following components:   Benzodiazepines POSITIVE (*)    All other components within normal limits  T4, FREE - Abnormal; Notable for the following components:   Free T4 1.34 (*)    All other components within normal limits  URINALYSIS, W/ REFLEX TO CULTURE (INFECTION SUSPECTED) - Abnormal; Notable for the following components:   APPearance HAZY (*)    Hgb urine dipstick LARGE (*)    Ketones, ur 5 (*)    Protein, ur 100 (*)    Leukocytes,Ua SMALL (*)    Bacteria, UA RARE (*)    All other components within normal limits  I-STAT CHEM 8, ED - Abnormal; Notable for the following components:   Sodium 134 (*)    Glucose, Bld 192 (*)    Hemoglobin 15.6 (*)    All other components within normal limits  CBG MONITORING, ED - Abnormal; Notable for the following components:   Glucose-Capillary 202 (*)    All other components within normal limits  I-STAT CG4 LACTIC ACID, ED - Abnormal; Notable for the following components:   Lactic Acid, Venous 2.3 (*)    All other components within normal limits  TROPONIN I (HIGH SENSITIVITY) - Abnormal; Notable for the following components:   Troponin I (High Sensitivity) 23 (*)    All other components within  normal limits  RESP PANEL BY RT-PCR (RSV, FLU A&B, COVID)  RVPGX2  CULTURE, BLOOD (ROUTINE X 2)  CULTURE, BLOOD (ROUTINE X 2)  URINE CULTURE  ETHANOL  PROTIME-INR  APTT  TSH    EKG: EKG Interpretation Date/Time:  Wednesday December 31 2023 22:38:41 EDT Ventricular Rate:  109 PR Interval:  169 QRS Duration:  111 QT Interval:  358 QTC Calculation: 483 R Axis:   -64  Text Interpretation: Sinus tachycardia Abnormal R-wave progression, early transition LVH with IVCD, LAD and secondary repol abnrm Nonspecific ST abnormality Confirmed by Yolande Charleston [phone]) on 12/31/2023 10:59:00 PM  Radiology: CT ANGIO HEAD NECK W WO CM (CODE STROKE) Result Date: 12/31/2023 CLINICAL DATA:  Neuro deficit, acute, stroke suspected EXAM: CT ANGIOGRAPHY HEAD AND NECK WITH AND WITHOUT CONTRAST TECHNIQUE: Multidetector CT imaging of the head and neck was performed using the standard protocol during bolus administration of intravenous contrast. Multiplanar CT image reconstructions and MIPs were obtained to evaluate the vascular anatomy. Carotid stenosis measurements (when applicable) are obtained utilizing NASCET criteria, using the distal internal carotid diameter as the denominator. RADIATION DOSE REDUCTION: This exam was performed according to the departmental dose-optimization program which includes automated exposure control, adjustment of the mA and/or kV according to patient size and/or use of iterative reconstruction technique. CONTRAST:  60mL OMNIPAQUE  IOHEXOL  350 MG/ML SOLN COMPARISON:  None Available. FINDINGS: CTA NECK FINDINGS Aortic arch: Great vessel origins are patent a significant stenosis. Right carotid system: Atherosclerosis at the carotid bifurcation and involving the proximal ICA with approximately 50% stenosis. Left carotid system: Atherosclerosis at the carotid bifurcation involving the proximal ICA with approximately 40% stenosis. Vertebral arteries: Codominant. No evidence of dissection, stenosis  (50% or greater), or occlusion. Skeleton: No acute abnormality on limited assessment. Other neck: No acute abnormality on limited assessment. Upper chest: Lung apices are clear. Review of the MIP images confirms the above findings CTA HEAD FINDINGS Anterior circulation: Bilateral intracranial ICAs, MCAs, and ACAs are patent. Moderate right A2 ACA stenosis. Posterior circulation: Bilateral intradural vertebral arteries, basilar artery and bilateral posterior cerebral arteries are patent. Mild multifocal stenosis of the PCAs bilaterally. Venous sinuses: As permitted by contrast timing, patent. Review of the MIP images confirms the above findings IMPRESSION: 1. No emergent large vessel occlusion. 2. Approximately 50% stenosis of the proximal right ICA and 40% stenosis of the proximal left ICA. 3. Moderate right A2 ACA stenosis. Electronically Signed   By: Gilmore GORMAN Molt M.D.   On: 12/31/2023 22:37   DG Chest Portable 1 View Result Date: 12/31/2023 CLINICAL DATA:  Fever EXAM: PORTABLE CHEST 1 VIEW COMPARISON:  03/27/2023 FINDINGS: Heart and mediastinal contours are within normal limits. Patchy opacities throughout the left lung and in the right upper lobe concerning for pneumonia. No effusions. No acute bony abnormality. IMPRESSION: Patchy bilateral opacities, left greater than right concerning for pneumonia. Electronically Signed   By: Franky Crease M.D.   On: 12/31/2023 22:35   CT HEAD CODE STROKE WO CONTRAST Result Date: 12/31/2023 CLINICAL DATA:  Code stroke.  Neuro deficit, acute, stroke suspected EXAM: CT HEAD WITHOUT CONTRAST TECHNIQUE: Contiguous axial images were obtained from the base of the skull through the vertex without intravenous contrast. RADIATION DOSE REDUCTION: This exam was performed according to the departmental dose-optimization program which includes automated exposure control, adjustment of the mA and/or kV according to patient size and/or use of iterative reconstruction technique.  COMPARISON:  CT head Oct 12, 2020. FINDINGS: Brain: No evidence of acute infarction, hemorrhage, hydrocephalus, extra-axial collection or mass lesion/mass effect. Patchy white matter hypodensities are compatible with chronic microvascular ischemic disease. Cerebral atrophy. Vascular: No hyperdense vessel. Skull: No acute fracture. Sinuses/Orbits: Clear sinuses.  No acute orbital findings. Other: No mastoid effusions. ASPECTS [facility] Stroke Program Early CT Score) Total score (0-10 with 10 being normal): 10. IMPRESSION: 1. No evidence of acute intracranial  abnormality.  ASPECTS is 10. 2. Chronic microvascular ischemic disease. Code stroke imaging results were communicated on 12/31/2023 at 10:21 pm to provider Dr. Michaela via secure text paging. Electronically Signed   By: Gilmore GORMAN Molt M.D.   On: 12/31/2023 22:21     Procedures   Medications Ordered in the ED  metroNIDAZOLE  (FLAGYL ) IVPB 500 mg (500 mg Intravenous New Bag/Given 12/31/23 2251)  vancomycin  (VANCOREADY) IVPB 1500 mg/300 mL (has no administration in time range)  ceFEPIme  (MAXIPIME ) 2 g in sodium chloride  0.9 % 100 mL IVPB (0 g Intravenous Stopped 12/31/23 2251)  iohexol  (OMNIPAQUE ) 350 MG/ML injection 60 mL (60 mLs Intravenous Contrast Given 12/31/23 2221)  acetaminophen  (TYLENOL ) suppository 650 mg (650 mg Rectal Given 12/31/23 2245)    Clinical Course as of 12/31/23 2349  Wed Dec 31, 2023  2237 Full code verified with husband. [RP]  2329 Signed out to Dr Haze [RP]    Clinical Course User Index [RP] Yolande Lamar BROCKS, MD                                 Medical Decision Making Amount and/or Complexity of Data Reviewed Labs: ordered. Radiology: ordered.  Risk OTC drugs. Prescription drug management. Decision regarding hospitalization.   78 year old female with history of hyperparathyroidism, hyperlipidemia, and CKD who presents emergency department with altered mental status and weakness.  Initial Ddx:   Stroke, ICH, hypertensive encephalopathy, sepsis, urinary tract infection, meningitis/encephalitis  MDM/Course:  Patient presents emergency department with weakness and altered mental status.  Initially thought the last known well was 8:30 PM.  After talking to her husband it was later found out that the last known well was actually this morning so was not a TNKase candidate.  She was a code stroke.  On arrival appear to be moving her right lower extremity less than usual and appeared to be very confused.  She had 2 L oxygen requirement.  She was protecting her airway.  She went in for head imaging that did not show any acute abnormalities.  She came back to her room was found to be febrile and tachycardic.  She was started on broad-spectrum antibiotics and had cultures that were sent.  It does appear that she has a UTI and after talking to her husband she actually went into urgent care earlier today because of urinary frequency and due to concerns for a UTI.  Considered meningitis/encephalitis but with her supple neck and urinary findings feel that UTI is more likely the source.  Also found to have possible pneumonia on her chest x-ray.  Upon re-evaluation patient remained stable.  Hospitalist consulted for admission.  This patient presents to the ED for concern of complaints listed in HPI, this involves an extensive number of treatment options, and is a complaint that carries with it a high risk of complications and morbidity. Disposition including potential need for admission considered.   Dispo: Admit to Floor  Additional history obtained from spouse Records reviewed Outpatient Clinic Notes The following labs were independently interpreted: Chemistry and show urinary tract infection I independently reviewed the following imaging with scope of interpretation limited to determining acute life threatening conditions related to emergency care: Chest x-ray and agree with the radiologist interpretation  with the following exceptions: none I personally reviewed and interpreted cardiac monitoring: sinus tachycardia I personally reviewed and interpreted the pt's EKG: see above for interpretation  I have reviewed the patients home medications  and made adjustments as needed Consults: Hospitalist and Neurology Social Determinants of health:  Geriatric  Portions of this note were generated with Scientist, clinical (histocompatibility and immunogenetics). Dictation errors may occur despite best attempts at proofreading.     Final diagnoses:  Altered mental status, unspecified altered mental status type  Sepsis, due to unspecified organism, unspecified whether acute organ dysfunction present [facility])  Urinary tract infection with hematuria, site unspecified  Stroke-like symptoms  Pneumonia due to infectious organism, unspecified laterality, unspecified part of lung   CRITICAL CARE Performed by: Lamar JAYSON Shan   Total critical care time: 30 minutes  Critical care time was exclusive of separately billable procedures and treating other patients.  Critical care was necessary to treat or prevent imminent or life-threatening deterioration.  Critical care was time spent personally by me on the following activities: development of treatment plan with patient and/or surrogate as well as nursing, discussions with consultants, evaluation of patient's response to treatment, examination of patient, obtaining history from patient or surrogate, ordering and performing treatments and interventions, ordering and review of laboratory studies, ordering and review of radiographic studies, pulse oximetry and re-evaluation of patient's condition.  ED Discharge Orders     None          Shan Lamar JAYSON, MD 12/31/23 [phone]

## 2023-12-31 NOTE — Plan of Care (Incomplete)
 78 year old female with history of CAD, HOCM, hyperlipidemia, anxiety/depression, arthritis, GERD presenting with altered mental status.  Per EMS report, patient was taken to urgent care earlier today for urinary frequency and some confusion.  He returned home and took a nap at around 5 PM and woke up at 8:30 PM with increased confusion, aphasia, and right-sided weakness.  When EMS arrived, patient was not moving the right side of her body.  Code stroke was activated and she was transported to the ED.  She was also hypoxic and placed on supplemental oxygen.  Febrile with temperature 104.6 F, tachycardic, and tachypneic.  Not hypotensive.  Requiring 2 L [place] to maintain sats in the 90s.  Labs showing no leukocytosis, hemoglobin 15.1, sodium 134, glucose 185, creatinine 1.0, T. bili 1.5, transaminases and alkaline phosphatase normal, ethanol level <15, TSH normal, free T4 1.34, troponin 23, lactic acid 2.3, blood cultures in process, UDS positive for benzodiazepines, UA pending, COVID/influenza/RSV PCR pending.  Chest x-ray showing patchy bilateral opacities, left greater than right concerning for pneumonia.  CT head showing no acute intracranial abnormality.  CTA head and neck negative for LVO.  Showing approximately 50% stenosis of the proximal right ICA, 40% stenosis of the proximal left ICA, and moderate right A2 ACA stenosis.  Neurology recommended brain MRI and stroke workup.  Patient was initially hypertensive on arrival to the ED and neurology recommended allowing permissive hypertension at this time with SBP up to 220 until stroke is ruled out.  Patient was given Tylenol , vancomycin , cefepime , and metronidazole .   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  EKG: Sinus tachycardia, LVH, T wave inversions in lateral  leads.

## 2024-01-01 ENCOUNTER — Inpatient Hospital Stay (HOSPITAL_COMMUNITY)

## 2024-01-01 ENCOUNTER — Other Ambulatory Visit: Payer: Self-pay

## 2024-01-01 DIAGNOSIS — J189 Pneumonia, unspecified organism: Secondary | ICD-10-CM | POA: Diagnosis not present

## 2024-01-01 DIAGNOSIS — E871 Hypo-osmolality and hyponatremia: Secondary | ICD-10-CM

## 2024-01-01 DIAGNOSIS — G9340 Encephalopathy, unspecified: Secondary | ICD-10-CM

## 2024-01-01 DIAGNOSIS — A419 Sepsis, unspecified organism: Secondary | ICD-10-CM

## 2024-01-01 DIAGNOSIS — J9601 Acute respiratory failure with hypoxia: Secondary | ICD-10-CM

## 2024-01-01 LAB — COMPREHENSIVE METABOLIC PANEL WITH GFR
ALT: 14 U/L (ref 0–44)
AST: 24 U/L (ref 15–41)
Albumin: 2.8 g/dL — ABNORMAL LOW (ref 3.5–5.0)
Alkaline Phosphatase: 79 U/L (ref 38–126)
Anion gap: 11 (ref 5–15)
BUN: 16 mg/dL (ref 8–23)
CO2: 22 mmol/L (ref 22–32)
Calcium: 9.8 mg/dL (ref 8.9–10.3)
Chloride: 102 mmol/L (ref 98–111)
Creatinine, Ser: 1.28 mg/dL — ABNORMAL HIGH (ref 0.44–1.00)
GFR, Estimated: 43 mL/min — ABNORMAL LOW (ref >60)
Glucose, Bld: 175 mg/dL — ABNORMAL HIGH (ref 70–99)
Potassium: 3.2 mmol/L — ABNORMAL LOW (ref 3.5–5.1)
Sodium: 135 mmol/L (ref 135–145)
Total Bilirubin: 1.2 mg/dL (ref 0.0–1.2)
Total Protein: 5.6 g/dL — ABNORMAL LOW (ref 6.5–8.1)

## 2024-01-01 LAB — TROPONIN I (HIGH SENSITIVITY)
Troponin I (High Sensitivity): 32 ng/L — ABNORMAL HIGH (ref <18)
Troponin I (High Sensitivity): 41 ng/L — ABNORMAL HIGH (ref <18)

## 2024-01-01 LAB — BLOOD CULTURE ID PANEL (REFLEXED) - BCID2

## 2024-01-01 LAB — CBC
HCT: 42.3 % (ref 36.0–46.0)
Hemoglobin: 14.3 g/dL (ref 12.0–15.0)
MCH: 32 pg (ref 26.0–34.0)
MCHC: 33.8 g/dL (ref 30.0–36.0)
MCV: 94.6 fL (ref 80.0–100.0)
Platelets: 163 K/uL (ref 150–400)
RBC: 4.47 MIL/uL (ref 3.87–5.11)
RDW: 12.5 % (ref 11.5–15.5)
WBC: 18.8 K/uL — ABNORMAL HIGH (ref 4.0–10.5)
nRBC: 0 % (ref 0.0–0.2)

## 2024-01-01 LAB — I-STAT CG4 LACTIC ACID, ED: Lactic Acid, Venous: 2 mmol/L (ref 0.5–1.9)

## 2024-01-01 MED ORDER — ESTRADIOL 0.5 MG PO TABS
0.5000 mg | ORAL_TABLET | Freq: Every day | ORAL | Status: DC
  Administered 2024-01-01 – 2024-01-02 (×2): 0.5 mg via ORAL
  Filled 2024-01-01 (×3): qty 1

## 2024-01-01 MED ORDER — ENOXAPARIN SODIUM 40 MG/0.4ML IJ SOSY: 40.0000 mg | PREFILLED_SYRINGE | Freq: Every day | INTRAMUSCULAR | Status: DC

## 2024-01-01 MED ORDER — SODIUM CHLORIDE 0.9 % IV BOLUS
500.0000 mL | Freq: Once | INTRAVENOUS | Status: AC
  Administered 2024-01-01: 500 mL via INTRAVENOUS

## 2024-01-01 MED ORDER — SODIUM CHLORIDE 0.9 % IV SOLN
1.0000 g | Freq: Once | INTRAVENOUS | Status: AC
  Administered 2024-01-01: 1 g via INTRAVENOUS
  Filled 2024-01-01: qty 10

## 2024-01-01 MED ORDER — ROSUVASTATIN CALCIUM 5 MG PO TABS
10.0000 mg | ORAL_TABLET | Freq: Every day | ORAL | Status: DC
  Administered 2024-01-01 – 2024-01-02 (×2): 10 mg via ORAL
  Filled 2024-01-01 (×2): qty 2

## 2024-01-01 MED ORDER — PANTOPRAZOLE SODIUM 40 MG PO TBEC
40.0000 mg | DELAYED_RELEASE_TABLET | Freq: Every day | ORAL | Status: DC
  Administered 2024-01-01 – 2024-01-02 (×2): 40 mg via ORAL
  Filled 2024-01-01 (×2): qty 1

## 2024-01-01 MED ORDER — ENOXAPARIN SODIUM 40 MG/0.4ML IJ SOSY: 40.0000 mg | PREFILLED_SYRINGE | Freq: Once | INTRAMUSCULAR | Status: DC

## 2024-01-01 MED ORDER — FLUOXETINE HCL 20 MG PO CAPS: 20.0000 mg | ORAL_CAPSULE | Freq: Every day | ORAL | Status: DC

## 2024-01-01 MED ORDER — SODIUM CHLORIDE 0.9 % IV SOLN: INTRAVENOUS | Status: AC

## 2024-01-01 MED ORDER — ACETAMINOPHEN 650 MG RE SUPP
650.0000 mg | Freq: Four times a day (QID) | RECTAL | Status: DC | PRN
Start: 2024-01-01 — End: 2024-01-03

## 2024-01-01 MED ORDER — ENOXAPARIN SODIUM 40 MG/0.4ML IJ SOSY
40.0000 mg | PREFILLED_SYRINGE | Freq: Every day | INTRAMUSCULAR | Status: DC
  Administered 2024-01-01 – 2024-01-02 (×2): 40 mg via SUBCUTANEOUS
  Filled 2024-01-01 (×2): qty 0.4

## 2024-01-01 MED ORDER — SODIUM CHLORIDE 0.9 % IV SOLN
100.0000 mg | Freq: Two times a day (BID) | INTRAVENOUS | Status: DC
  Administered 2024-01-01: 100 mg via INTRAVENOUS
  Filled 2024-01-01 (×2): qty 100

## 2024-01-01 MED ORDER — SODIUM CHLORIDE 0.9 % IV SOLN
2.0000 g | INTRAVENOUS | Status: DC
  Administered 2024-01-02: 2 g via INTRAVENOUS
  Filled 2024-01-01: qty 20

## 2024-01-01 MED ORDER — FLUOXETINE HCL 20 MG PO CAPS
20.0000 mg | ORAL_CAPSULE | Freq: Every day | ORAL | Status: DC
  Administered 2024-01-01 – 2024-01-02 (×2): 20 mg via ORAL
  Filled 2024-01-01 (×2): qty 1

## 2024-01-01 MED ORDER — SODIUM CHLORIDE 0.9 % IV SOLN
1.0000 g | INTRAVENOUS | Status: DC
  Administered 2024-01-01: 1 g via INTRAVENOUS
  Filled 2024-01-01: qty 10

## 2024-01-01 MED ORDER — ACETAMINOPHEN 325 MG PO TABS
650.0000 mg | ORAL_TABLET | Freq: Four times a day (QID) | ORAL | Status: DC | PRN
  Administered 2024-01-02: 650 mg via ORAL
  Filled 2024-01-01: qty 2

## 2024-01-01 MED ORDER — ASPIRIN 81 MG PO TBEC
81.0000 mg | DELAYED_RELEASE_TABLET | Freq: Every day | ORAL | Status: DC
  Administered 2024-01-01 – 2024-01-02 (×2): 81 mg via ORAL
  Filled 2024-01-01 (×2): qty 1

## 2024-01-01 MED ORDER — ADULT MULTIVITAMIN W/MINERALS CH
1.0000 | ORAL_TABLET | Freq: Every day | ORAL | Status: DC
  Administered 2024-01-01 – 2024-01-02 (×2): 1 via ORAL
  Filled 2024-01-01 (×2): qty 1

## 2024-01-01 NOTE — Plan of Care (Signed)
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
## 2024-01-01 NOTE — Progress Notes (Signed)
 PHARMACY - PHYSICIAN COMMUNICATION CRITICAL VALUE ALERT - BLOOD CULTURE IDENTIFICATION (BCID)  Christina  Nakai Melendez is an 78 y.o. female who presented to [facility] on 12/31/2023 with a chief complaint of altered mental status.   Assessment:  78 year old female admitted with AMS- was seen earlier for urinary urgency/frequency. Now with E coli in 3/4 blood cultures. Likely urinary frequency.   Name of physician (or Provider) ContactedBETHA Room, MD  Current antibiotics: ceftriaxone /doxy  Changes to prescribed antibiotics recommended:  Ceftriaxone  increased to 2 gm . Consider d/c doxycycline   Results for orders placed or performed during the hospital encounter of 12/31/23  Blood Culture ID Panel (Reflexed) (Collected: 12/31/2023 10:36 PM)  Result Value Ref Range   Enterococcus faecalis NOT DETECTED NOT DETECTED   Enterococcus Faecium NOT DETECTED NOT DETECTED   Listeria monocytogenes NOT DETECTED NOT DETECTED   Staphylococcus species NOT DETECTED NOT DETECTED   Staphylococcus aureus (BCID) NOT DETECTED NOT DETECTED   Staphylococcus epidermidis NOT DETECTED NOT DETECTED   Staphylococcus lugdunensis NOT DETECTED NOT DETECTED   Streptococcus species NOT DETECTED NOT DETECTED   Streptococcus agalactiae NOT DETECTED NOT DETECTED   Streptococcus pneumoniae NOT DETECTED NOT DETECTED   Streptococcus pyogenes NOT DETECTED NOT DETECTED   A.calcoaceticus-baumannii NOT DETECTED NOT DETECTED   Bacteroides fragilis NOT DETECTED NOT DETECTED   Enterobacterales DETECTED (A) NOT DETECTED   Enterobacter cloacae complex NOT DETECTED NOT DETECTED   Escherichia coli DETECTED (A) NOT DETECTED   Klebsiella aerogenes NOT DETECTED NOT DETECTED   Klebsiella oxytoca NOT DETECTED NOT DETECTED   Klebsiella pneumoniae NOT DETECTED NOT DETECTED   Proteus species NOT DETECTED NOT DETECTED   Salmonella species NOT DETECTED NOT DETECTED   Serratia marcescens NOT DETECTED NOT DETECTED   Haemophilus influenzae NOT  DETECTED NOT DETECTED   Neisseria meningitidis NOT DETECTED NOT DETECTED   Pseudomonas aeruginosa NOT DETECTED NOT DETECTED   Stenotrophomonas maltophilia NOT DETECTED NOT DETECTED   Candida albicans NOT DETECTED NOT DETECTED   Candida auris NOT DETECTED NOT DETECTED   Candida glabrata NOT DETECTED NOT DETECTED   Candida krusei NOT DETECTED NOT DETECTED   Candida parapsilosis NOT DETECTED NOT DETECTED   Candida tropicalis NOT DETECTED NOT DETECTED   Cryptococcus neoformans/gattii NOT DETECTED NOT DETECTED   CTX-M ESBL NOT DETECTED NOT DETECTED   Carbapenem resistance IMP NOT DETECTED NOT DETECTED   Carbapenem resistance KPC NOT DETECTED NOT DETECTED   Carbapenem resistance NDM NOT DETECTED NOT DETECTED   Carbapenem resist OXA 48 LIKE NOT DETECTED NOT DETECTED   Carbapenem resistance VIM NOT DETECTED NOT DETECTED    Damien Quiet, PharmD, BCPS, BCIDP Infectious Diseases Clinical Pharmacist Phone: [phone] 01/01/2024  10:59 AM

## 2024-01-01 NOTE — Progress Notes (Signed)
 EEG complete, results are pending.

## 2024-01-01 NOTE — Code Documentation (Signed)
 Stroke Response Nurse Documentation Code Documentation  Christina  Christina Melendez is a 78 y.o. female arriving to [facility]  via [facility] on 7/30 with past medical hx of HLD, GERD, cardiomyopathy. On No antithrombotic. Code stroke was activated by EMS.   Patient from home where she was LKW at 1700 and now complaining of aphasia, right sided weakness and AMS.   Stroke team at the bedside on patient arrival. Labs drawn and patient cleared for CT by Dr. Yolande. Patient to CT with team. NIHSS 8, see documentation for details and code stroke times. Patient with decreased LOC, disoriented, bilateral arm weakness, bilateral leg weakness, and Expressive aphasia  on exam. The following imaging was completed:  CT Head and CTA. Patient is not a candidate for IV Thrombolytic due to outside of window. Patient is not a candidate for IR due to No LVO.   Care Plan: Neuro checks q2 hrs.    Bedside handoff with Christina Melendez.    Christina Melendez ORN  Rapid Response RN

## 2024-01-01 NOTE — ED Notes (Signed)
 Pt taken off of bedpan. Family at bedside and call light in reach.

## 2024-01-01 NOTE — Procedures (Signed)
 Patient Name: Christina Melendez  MRN: [id number]  Epilepsy Attending: Pastor Falling  Referring Physician/Provider: No ref. provider found      Date: 01/01/2024 Duration: 23 minutes   Patient history: 78 y.o. female with medical history significant of CAD, HOCM, hyperlipidemia, anxiety/depression, arthritis, GERD presenting to the ED via EMS as code stroke for evaluation of altered mental status. EEG to evaluate for seizure  Level of alertness: Awake  AEDs during EEG study:   Technical aspects: This EEG study was done with scalp electrodes positioned according to the 10-20 International system of electrode placement. Electrical activity was reviewed with band pass filter of 1-70Hz , sensitivity of 7 uV/mm, display speed of 45mm/sec with a 60Hz  notched filter applied as appropriate. EEG data were recorded continuously and digitally stored.  Video monitoring was available and reviewed as appropriate.  Description: The posterior dominant rhythm consists of 7-8 Hz activity of moderate voltage (25-35 uV) seen predominantly in posterior head regions, symmetric and reactive to eye opening and eye closing. Drowsiness was not seen. Sleep was not seen.   Hyperventilation and photic stimulation were not performed.     ABNORMALITY - Intermittent slow, generalized   IMPRESSION: This study is suggestive of mild diffuse encephalopathy, nonspecific etiology but likely related to sedation, toxic-metabolic etiology. No seizures or epileptiform discharges were seen throughout the recording.   Pastor Falling MD Neurology
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
## 2024-01-01 NOTE — Care Management (Signed)
  Insurance and Status Insurance coverage has been reviewed  Patient has primary care physician Yes  Home environment has been reviewed lives with spouse  Prior level of function: Independent  Prior/Current Home Services No current home services  Social Drivers of Health Review SDOH reviewed no interventions necessary  Readmission risk has been reviewed Yes  Transition of care needs no transition of care needs at this time
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
## 2024-01-01 NOTE — Progress Notes (Addendum)
 NEUROLOGY CONSULT FOLLOW UP NOTE   Date of service: January 01, 2024 Patient Name: Christina Melendez  Christina Melendez Melendez MRN:  [id number] DOB:  November 27, 1945  Interval Hx/subjective   Patient examined at bedside.  Husband was present for the interview and exam. Christina Melendez Melendez tells me her overall weakness has improved and has been able to pee.  She had not been able to completely void for the past 5 days, with polyuria every 30 minutes or so with incomplete voiding.  No dysuria or abdominal pain associated with that.  She also tells me that she had not been around sick contacts and denies respiratory symptoms.  Yesterday her husband had taken her to her doctor's office around 3 PM because of the inability to pee and she could not give urine sample.  She denied any suprapubic tenderness but has been feeling a colicky like back pain that radiates to the front for the past month.  Later on at night her husband took her to the ED because of her change in mentation and concern for fever.  Christina Melendez Melendez reports his wife is speaking at her usual baseline.  Still seems a bit confused and has difficulty recalling some details but that this is slowly improving.  He also tells me that she has been taking melatonin and temazepam  for the past 13 years, both of which she took prior to coming to the hospital at night.  She often feels groggy/drowsy in the morning after taking these medications but is unable to sleep without it.  Patient currently denies chest pain, headache, neck pain, rigors. No prior history of dementia or changes to memory.  No seizure-like activity, loss of consciousness, falls.  Continues to have some shortness of breath without the supplemental oxygen.  Currently has good appetite and is quite thirsty.  Has not been able to ambulate since she got to the ED for fear of falling, and continues to have the feeling of incomplete pleat voiding.  RN at bedside reiterates that concerns about polyuria and incomplete voiding.  He was  ready to do a bladder scan after my visit with the patient.  Vitals   Vitals:   01/01/24 0330 01/01/24 0415 01/01/24 0430 01/01/24 0607  BP: 93/62 (!) 102/59 (!) 108/55   Pulse: 94 90 93   Resp: (!) 24 (!) 28 (!) 23   Temp:    98.7 F (37.1 C)  TempSrc:    Rectal  SpO2: 98% 92% 97%      There is no height or weight on file to calculate BMI.  Physical Exam   Constitutional: Appears well-developed and well-nourished.  Psych: Affect appropriate to situation.  Eyes: No scleral injection.  HENT: No OP obstrucion.  Dry mucous membranes Head: Normocephalic.  Atraumatic Cardiovascular: Normal rate and regular rhythm.  Systolic murmur present Respiratory: Effort normal, non-labored breathing on 2 L supplemental nasal cannula GI: Soft.  No distension. There is no suprapubic. Skin: WDI.   Neurologic Examination   Neurologic exam: Mental status: A& oriented to self and to place. Not able to recall month, year, or presenting symptoms. Unable to count backward, tell me that Saturday comes before Sunday, or realize that [facility] is a mouse. Cranial Nerves:             II: PERRL             III, IV, VI: Extra-occular motions intact bilaterally             V, VII: Face symmetric, sensation  intact in all 3 divisions               VIII: hearing normal to rubbing fingers bilaterally               IX, X: palate rises symmetrically             XI: Head turn and shoulder shrug normal bilaterally               XII: tongue midline    Motor: Strength 5/5 on all upper and lower extremities, bulk muscle and tone are normal Deep Tendon Reflexes: 2+ symmetric downturning toes.  Gait: deferref Sensory: Light touch intact and symmetric bilaterally  Coordination: There is no dysmetria on finger-to-nose. Heel to shin test normal. No down drift of LE. Psychiatric: Normal mood and affect   Medications  Current Facility-Administered Medications:    0.9 %  sodium chloride  infusion, , Intravenous,  Continuous, Alfornia Madison, MD, Last Rate: 75 mL/hr at 01/01/24 0607, New Bag at 01/01/24 0607   acetaminophen  (TYLENOL ) tablet 650 mg, 650 mg, Oral, Q6H PRN **OR** acetaminophen  (TYLENOL ) suppository 650 mg, 650 mg, Rectal, Q6H PRN, Alfornia Madison, MD   aspirin  EC tablet 81 mg, 81 mg, Oral, Daily, Rathore, Vasundhra, MD   cefTRIAXone  (ROCEPHIN ) 1 g in sodium chloride  0.9 % 100 mL IVPB, 1 g, Intravenous, Q24H, Rathore, Vasundhra, MD   doxycycline  (VIBRAMYCIN ) 100 mg in sodium chloride  0.9 % 250 mL IVPB, 100 mg, Intravenous, Q12H, Rathore, Vasundhra, MD   enoxaparin  (LOVENOX ) injection 40 mg, 40 mg, Subcutaneous, Daily, Alfornia Madison, MD   estradiol  (ESTRACE ) tablet 0.5 mg, 0.5 mg, Oral, Daily, Rathore, Vasundhra, MD   pantoprazole  (PROTONIX ) EC tablet 40 mg, 40 mg, Oral, Daily, Rathore, Vasundhra, MD   rosuvastatin  (CRESTOR ) tablet 10 mg, 10 mg, Oral, Daily, Alfornia Madison, MD  Current Outpatient Medications:    aspirin  EC 81 MG tablet, Take 81 mg by mouth daily. Swallow whole., Disp: , Rfl:    Calcium  Carb-Cholecalciferol (CALCIUM  + D3 PO), Take 1 tablet by mouth in the morning and at bedtime., Disp: , Rfl:    esomeprazole (NEXIUM) 20 MG capsule, Take 20 mg by mouth daily., Disp: , Rfl:    estradiol  (ESTRACE ) 0.5 MG tablet, Take 0.5 mg by mouth daily., Disp: , Rfl:    FLUoxetine  (PROZAC ) 20 MG capsule, Take 20 mg by mouth daily., Disp: , Rfl: 3   meclizine  (ANTIVERT ) 25 MG tablet, Take 50 mg by mouth 2 (two) times daily., Disp: , Rfl:    metoprolol  tartrate (LOPRESSOR ) 25 MG tablet, Take 1 tablet (25 mg total) by mouth 2 (two) times daily., Disp: 180 tablet, Rfl: 3   ondansetron  (ZOFRAN ) 4 MG tablet, Take 4 mg by mouth every 8 (eight) hours as needed for nausea or vomiting., Disp: , Rfl:    rosuvastatin  (CRESTOR ) 10 MG tablet, Take 1 tablet (10 mg total) by mouth daily., Disp: 90 tablet, Rfl: 3   temazepam  (RESTORIL ) 30 MG capsule, Take 30 mg by mouth at bedtime., Disp: , Rfl:     traMADol (ULTRAM) 50 MG tablet, Take 50-100 mg by mouth See admin instructions. Take 2 tablets by mouth in the morning and 1 tablet at night, Disp: , Rfl:    furosemide  (LASIX ) 20 MG tablet, Take 1 tablet (20 mg total) by mouth every Monday, Wednesday, and Friday., Disp: 40 tablet, Rfl: 3  Labs and Diagnostic Imaging   CBC:  Recent Labs  Lab 12/31/23 2213 01/01/24 0414  WBC 9.3  18.8*  NEUTROABS 8.5*  --   HGB 15.1* 14.3  HCT 44.3 42.3  MCV 93.7 94.6  PLT 157 163    Basic Metabolic Panel:  Lab Results  Component Value Date   NA 135 01/01/2024   K 3.2 (L) 01/01/2024   CO2 22 01/01/2024   GLUCOSE 175 (H) 01/01/2024   BUN 16 01/01/2024   CREATININE 1.28 (H) 01/01/2024   CALCIUM  9.8 01/01/2024   GFRNONAA 43 (L) 01/01/2024   GFRAA 101 12/09/2019   Lipid Panel:  Lab Results  Component Value Date   LDLCALC 59 05/23/2023   HgbA1c: No results found for: HGBA1C Urine Drug Screen:     Component Value Date/Time   LABOPIA NONE DETECTED 12/31/2023 2242   COCAINSCRNUR NONE DETECTED 12/31/2023 2242   LABBENZ POSITIVE (A) 12/31/2023 2242   AMPHETMU NONE DETECTED 12/31/2023 2242   THCU NONE DETECTED 12/31/2023 2242   LABBARB NONE DETECTED 12/31/2023 2242    Alcohol Level     Component Value Date/Time   ETH <15 12/31/2023 2213   INR  Lab Results  Component Value Date   INR 1.1 12/31/2023   APTT  Lab Results  Component Value Date   APTT 31 12/31/2023   AED levels: No results found for: PHENYTOIN, ZONISAMIDE, LAMOTRIGINE, LEVETIRACETA  CT Head without contrast(Personally reviewed): 7/30: No evidence of acute intracranial abnormality, Chronic microvascular ischemic disease noted  CT angio Head and Neck with contrast(Personally reviewed): 7/30: No large vessel occlusion.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 Stenosis of the proximal left ICA. Moderate right A2 ACA stenosis.   rEEG:  Pending  Assessment   [place]  Neomi Laidler is a  78 y.o. female with a pertinent past medical history of HFpEF, HOCM, HLD, OA of knee, admitted for acute encephalopathy, currently being monitored by [facility] for stroke rule out and being managed for sepsis and acute hypoxemic respiratory failure secondary to community acquired pneumonia   Acute encephalopathy: infectious etiology likely Respiratory vs urinary source at this time. Currently on Doxycycline  and CTX. Patient also on centrally acting medications but not recent changes to dosing. EEG pending. Initial CT head wo contrast without acute insult; unable to pursue MRI. Will further evaluate with CT head wo contrast today for evolving infarct evaluation, thought low index of suspicion.  Acute hypoxemic respiratory failure due to CAP. On 2L [place] currently and CAP ABX coverage. No respiratory sxs otherwise. WBC increasing. Bcx pending ?UTI: Pyuria, microscopic hematuria, and trace bacteria, however, HPI concerning for UTI. Bcx pending. Defer to primary for further evaluation. Hypertension: Improved.  Will follow up CT head today. If no acute infarct, patient is to remain normotensive. Electrolyte imbalances: Per primary team Mild AKI: Improving. Patient appears volume depleted today.  Subclinical hyperthyroidism: unlikely driving presentation. Re-evaluate in the outpatient, non acute setting Other active problems per primary team  Recommendations  - Continue treating underlying infection - Repeat head CT today; pending final read - Spot EEG today; ordered - Thank you for the consult. If CT head and EEG negative, Neurology will sign off ______________________________________________________________________   Signed, Hadassah Ala, MD [facility] resident [facility]   NEUROHOSPITALIST ADDENDUM Performed a face to face diagnostic evaluation.   I have reviewed the contents of history and physical exam as documented by  PA/ARNP/Resident and agree with above documentation.  I have discussed and formulated the above plan as documented. Edits to the note have been made as needed.  Impression/Key exam findings/Plan: husband  is at bedside, confusion is significantly improved and now closer to her baseline. Will get repeat CT Head but I would not expect and infarct to improve this quickly. No obvious seizures noted by husband and no hx of seizures. If the rEEG is negative, she can be dischagred with outpatient follow up.  We will signoff. Plan discussed with Dr. Dino over secure chat.  Clifton Kovacic, MD [facility] [phone]   If 7pm to 7am, please call on call as listed on AMION.

## 2024-01-02 ENCOUNTER — Inpatient Hospital Stay (HOSPITAL_COMMUNITY)

## 2024-01-02 DIAGNOSIS — G9340 Encephalopathy, unspecified: Secondary | ICD-10-CM | POA: Diagnosis not present

## 2024-01-02 LAB — MAGNESIUM: Magnesium: 1.8 mg/dL (ref 1.7–2.4)

## 2024-01-02 LAB — CBC WITH DIFFERENTIAL/PLATELET
Abs Immature Granulocytes: 0.08 K/uL — ABNORMAL HIGH (ref 0.00–0.07)
Basophils Absolute: 0 K/uL (ref 0.0–0.1)
Basophils Relative: 0 %
Eosinophils Absolute: 0 K/uL (ref 0.0–0.5)
Eosinophils Relative: 0 %
HCT: 39.1 % (ref 36.0–46.0)
Hemoglobin: 13.4 g/dL (ref 12.0–15.0)
Immature Granulocytes: 1 %
Lymphocytes Relative: 7 %
Lymphs Abs: 0.9 K/uL (ref 0.7–4.0)
MCH: 31.6 pg (ref 26.0–34.0)
MCHC: 34.3 g/dL (ref 30.0–36.0)
MCV: 92.2 fL (ref 80.0–100.0)
Monocytes Absolute: 1.1 K/uL — ABNORMAL HIGH (ref 0.1–1.0)
Monocytes Relative: 9 %
Neutro Abs: 10.8 K/uL — ABNORMAL HIGH (ref 1.7–7.7)
Neutrophils Relative %: 83 %
Platelets: 163 K/uL (ref 150–400)
RBC: 4.24 MIL/uL (ref 3.87–5.11)
RDW: 12.5 % (ref 11.5–15.5)
WBC: 12.9 K/uL — ABNORMAL HIGH (ref 4.0–10.5)
nRBC: 0 % (ref 0.0–0.2)

## 2024-01-02 LAB — STREP PNEUMONIAE URINARY ANTIGEN: Strep Pneumo Urinary Antigen: NEGATIVE

## 2024-01-02 LAB — BASIC METABOLIC PANEL WITH GFR
Anion gap: 11 (ref 5–15)
BUN: 12 mg/dL (ref 8–23)
CO2: 25 mmol/L (ref 22–32)
Calcium: 9.5 mg/dL (ref 8.9–10.3)
Chloride: 100 mmol/L (ref 98–111)
Creatinine, Ser: 1.01 mg/dL — ABNORMAL HIGH (ref 0.44–1.00)
GFR, Estimated: 57 mL/min — ABNORMAL LOW (ref >60)
Glucose, Bld: 145 mg/dL — ABNORMAL HIGH (ref 70–99)
Potassium: 3.9 mmol/L (ref 3.5–5.1)
Sodium: 136 mmol/L (ref 135–145)

## 2024-01-02 LAB — URINE CULTURE: Culture: 20000 — AB

## 2024-01-02 LAB — MRSA NEXT GEN BY PCR, NASAL: MRSA by PCR Next Gen: NOT DETECTED

## 2024-01-02 LAB — PROCALCITONIN: Procalcitonin: 22 ng/mL

## 2024-01-02 LAB — C-REACTIVE PROTEIN: CRP: 33.6 mg/dL — ABNORMAL HIGH (ref <1.0)

## 2024-01-02 LAB — AMMONIA: Ammonia: 25 umol/L (ref 9–35)

## 2024-01-02 LAB — BRAIN NATRIURETIC PEPTIDE: B Natriuretic Peptide: 744.1 pg/mL — ABNORMAL HIGH (ref 0.0–100.0)

## 2024-01-02 MED ORDER — METOPROLOL TARTRATE 50 MG PO TABS
50.0000 mg | ORAL_TABLET | Freq: Two times a day (BID) | ORAL | Status: DC
  Administered 2024-01-02 (×2): 50 mg via ORAL
  Filled 2024-01-02 (×2): qty 1

## 2024-01-02 MED ORDER — LACTATED RINGERS IV SOLN: INTRAVENOUS | Status: DC

## 2024-01-02 MED ORDER — LACTATED RINGERS IV BOLUS
500.0000 mL | Freq: Once | INTRAVENOUS | Status: AC
  Administered 2024-01-02: 500 mL via INTRAVENOUS

## 2024-01-02 MED ORDER — LACTATED RINGERS IV SOLN: INTRAVENOUS | Status: AC

## 2024-01-02 MED ORDER — CEFAZOLIN SODIUM-DEXTROSE 2-4 GM/100ML-% IV SOLN
2.0000 g | Freq: Three times a day (TID) | INTRAVENOUS | Status: DC
  Administered 2024-01-03: 2 g via INTRAVENOUS
  Filled 2024-01-02: qty 100

## 2024-01-02 NOTE — Plan of Care (Signed)

## 2024-01-02 NOTE — Plan of Care (Signed)
   Problem: Nutrition: Goal: Adequate nutrition will be maintained Outcome: Progressing   Problem: Coping: Goal: Level of anxiety will decrease Outcome: Progressing   Problem: Elimination: Goal: Will not experience complications related to bowel motility Outcome: Progressing

## 2024-01-02 NOTE — Progress Notes (Signed)
 Physical Therapy Treatment Patient Details Name: Christina Melendez MRN: [id number] DOB: 15-Mar-1946 Today's Date: 01/02/2024   History of Present Illness 78 y.o. female  presenting to the ED via EMS as code stroke for evaluation of altered mental status and acute onset right-sided weakness, symptoms improved now, found to have E. coli bacteremia and likely source appears to be urinary tract infection and she is currently on intravenous antibiotics. Past medical history significant of CAD, HOCM, hyperlipidemia, anxiety/depression, arthritis, GERD.    PT Comments  Pt seen for second session at request of nursing to return pt to bed. Pt required more assistance this time requiring +2 Mod A to transfer. No change in DC/DME recs at this time. PT will continue to follow.     If plan is discharge home, recommend the following: A little help with walking and/or transfers;A little help with bathing/dressing/bathroom;Assistance with cooking/housework;Direct supervision/assist for medications management;Assist for transportation;Help with stairs or ramp for entrance;Supervision due to cognitive status   Can travel by private vehicle        Equipment Recommendations  Rolling walker (2 wheels)    Recommendations for Other Services OT consult     Precautions / Restrictions Precautions Precautions: Fall Recall of Precautions/Restrictions: Impaired Restrictions Weight Bearing Restrictions Per Provider Order: No     Mobility  Bed Mobility Overal bed mobility: Needs Assistance Bed Mobility: Sit to Supine     Supine to sit: Min assist Sit to supine: Min assist   General bed mobility comments: Min A for trunk elevation due to posterior lean. Constant cues for sequencing.    Transfers Overall transfer level: Needs assistance Equipment used: Rolling walker (2 wheels) Transfers: Sit to/from Stand, Bed to chair/wheelchair/BSC Sit to Stand: +2 physical assistance, Mod assist   Step pivot  transfers: +2 physical assistance, Mod assist       General transfer comment: +2 Mod A to stand and step pivot from chair back to bed.    Ambulation/Gait Ambulation/Gait assistance: +2 physical assistance, Min assist Gait Distance (Feet): 10 Feet Assistive device: 2 person hand held assist Gait Pattern/deviations: Trunk flexed, Decreased stride length, Step-through pattern, Narrow base of support Gait velocity: decreased     General Gait Details: +2 Min A via HHA to ambulate around bed to chair.   Stairs             Wheelchair Mobility     Tilt Bed    Modified Rankin (Stroke Patients Only)       Balance Overall balance assessment: Needs assistance Sitting-balance support: Bilateral upper extremity supported, Feet supported Sitting balance-Leahy Scale: Poor Sitting balance - Comments: Intermittent posterior lean Postural control: Posterior lean Standing balance support: Bilateral upper extremity supported, During functional activity Standing balance-Leahy Scale: Poor Standing balance comment: Reliant on external support                            Communication Communication Communication: No apparent difficulties  Cognition Arousal: Alert Behavior During Therapy: WFL for tasks assessed/performed   PT - Cognitive impairments: Problem solving, Safety/Judgement, Sequencing, Initiation                       PT - Cognition Comments: Poor safety awareness with difficulty sequencing and problem solving. Following commands: Impaired Following commands impaired: Follows one step commands inconsistently, Follows one step commands with increased time    Cueing Cueing Techniques: Verbal cues, Tactile cues  Exercises  General Comments General comments (skin integrity, edema, etc.): VSS      Pertinent Vitals/Pain Pain Assessment Pain Assessment: No/denies pain    Home Living Family/patient expects to be discharged to:: Private  residence Living Arrangements: Spouse/significant other Available Help at Discharge: Family;Available 24 hours/day Type of Home: House (Townhouse) Home Access: Level entry       Home Layout: One level Home Equipment: Shower seat;Grab bars - toilet;Grab bars - tub/shower;Hand held shower head      Prior Function            PT Goals (current goals can now be found in the care plan section) Acute Rehab PT Goals Patient Stated Goal: to go home PT Goal Formulation: With patient Time For Goal Achievement: 01/16/24 Potential to Achieve Goals: Fair Progress towards PT goals: Progressing toward goals    Frequency    Min 2X/week      PT Plan      Co-evaluation              AM-PAC PT 6 Clicks Mobility   Outcome Measure  Help needed turning from your back to your side while in a flat bed without using bedrails?: A Little Help needed moving from lying on your back to sitting on the side of a flat bed without using bedrails?: A Little Help needed moving to and from a bed to a chair (including a wheelchair)?: A Little Help needed standing up from a chair using your arms (e.g., wheelchair or bedside chair)?: A Little Help needed to walk in hospital room?: A Little Help needed climbing 3-5 steps with a railing? : A Lot 6 Click Score: 17    End of Session Equipment Utilized During Treatment: Gait belt Activity Tolerance: Patient tolerated treatment well Patient left: in chair;with call bell/phone within reach;with chair alarm set Nurse Communication: Mobility status PT Visit Diagnosis: Other abnormalities of gait and mobility (R26.89)     Time: 8956-8948 PT Time Calculation (min) (ACUTE ONLY): 8 min  Charges:    $Therapeutic Activity: 8-22 mins PT General Charges $$ ACUTE PT VISIT: 1 Visit                     Sueellen NOVAK, PT, DPT Acute Rehab Services [phone]    Telvin Reinders 01/02/2024, 4:12 PM

## 2024-01-02 NOTE — Progress Notes (Signed)
 PROGRESS NOTE    Christina  Christina Melendez  FMW:[id number] DOB: [birth year] DOA: 12/31/2023 PCP: Marvene Prentice SAUNDERS, FNP   Brief Narrative:   78 y.o. female with medical history significant of CAD, HOCM, hyperlipidemia, anxiety/depression, arthritis, GERD presenting to the ED via EMS as code stroke for evaluation of altered mental status and acute onset right-sided weakness, symptoms improved now, found to have E. coli bacteremia and likely source appears to be urinary tract infection and she is currently on intravenous antibiotics.  Assessment & Plan:     Acute metabolic encephalopathy : Resolving due to sepsis caused by E. coli bacteremia source UTI versus pneumonia both present on admission.  She is treated with supportive care, sepsis pathophysiology has resolved, mental status close to baseline, head CT, EEG and CTA unremarkable, no headache or focal deficits.  Continue supportive care.   Severe sepsis and with E. coli bacteremia source UTI versus pneumonia : Present on admission, on Rocephin  responding well continue, continue gentle hydration she still appears quite dehydrated clinically urine very concentrated and dark, advance activity, add I-S and flutter valve for pulmonary toiletry titrate out oxygen if she can tolerate.  CAD Elevated troponin/type II MI EKG showing T wave inversions in lateral leads, no significant change compared to previous EKGs.  Troponin 23>41.  Trend flat and in non-ACS pattern, no acute issues no chest pain.     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  Compensated no acute issue.   Hyperlipidemia Continue Crestor .   GERD Continue PPI.  Diet: Cardiac diet  Disposition: Home.  Patient lives at home with her husband.  DVT prophylaxis: enoxaparin  (LOVENOX ) injection 40 mg Start: 01/01/24 1000     Code  Status: Full Code Family Communication: Husband at the bedside on 01/02/2024 Status is: Inpatient Remains inpatient appropriate because: bacteremia    Subjective:   Patient in bed, appears comfortable, denies any headache, no fever, no chest pain or pressure, no shortness of breath , no abdominal pain. No new focal weakness.    Diet Orders (From admission, onward)     Start     Ordered   01/01/24 1300  Diet Heart Room service appropriate? Yes; Fluid consistency: Thin  Diet effective now       Question Answer Comment  Room service appropriate? Yes   Fluid consistency: Thin      01/01/24 1259            Objective: Vitals:   01/01/24 2355 01/02/24 0306 01/02/24 0400 01/02/24 0821  BP: 129/83 (!) 144/74 124/80 (!) 144/67  Pulse: (!) 123 (!) 113 (!) 126 (!) 128  Resp: (!) 23 17 (!) 21 20  Temp: 100.3 F (37.9 C) 99.8 F (37.7 C)  98.7 F (37.1 C)  TempSrc: Oral Oral  Oral  SpO2: 90% 97% 99% 95%  Weight:      Height:       No intake or output data in the 24 hours ending 01/02/24 0849  Filed Weights   01/01/24 1140  Weight: 81 kg    Exam  Awake Alert, No new F.N deficits, Normal affect [place].AT,PERRAL Supple Neck, No JVD,   Symmetrical Chest wall movement, Good air movement bilaterally, CTAB RRR,No Gallops, Rubs or new Murmurs,  +ve B.Sounds, Abd Soft, No tenderness,   No Cyanosis, Clubbing or edema    Data Review:  Inpatient Medications  Scheduled Meds:  aspirin  EC  81 mg Oral Daily   enoxaparin  (LOVENOX ) injection  40 mg Subcutaneous Daily   estradiol   0.5 mg Oral Daily   FLUoxetine   20 mg Oral Daily   metoprolol  tartrate  50 mg Oral BID   multivitamin with minerals  1 tablet Oral Daily   pantoprazole   40 mg Oral Daily   rosuvastatin   10 mg Oral Daily   Continuous Infusions:  cefTRIAXone  (ROCEPHIN )  IV     lactated ringers      lactated ringers      PRN Meds:.acetaminophen  **OR** acetaminophen   DVT Prophylaxis  enoxaparin  (LOVENOX ) injection 40  mg Start: 01/01/24 1000       Recent Labs  Lab 12/31/23 2210 12/31/23 2213 01/01/24 0414 01/02/24 0631  WBC  --  9.3 18.8* 12.9*  HGB 15.6* 15.1* 14.3 13.4  HCT 46.0 44.3 42.3 39.1  PLT  --  157 163 163  MCV  --  93.7 94.6 92.2  MCH  --  31.9 32.0 31.6  MCHC  --  34.1 33.8 34.3  RDW  --  12.3 12.5 12.5  LYMPHSABS  --  0.6*  --  0.9  MONOABS  --  0.2  --  1.1*  EOSABS  --  0.0  --  0.0  BASOSABS  --  0.0  --  0.0    Recent Labs  Lab 12/31/23 2210 12/31/23 2213 12/31/23 2223 01/01/24 0007 01/01/24 0414 01/02/24 0631  NA 134* 134*  --   --  135 136  K 3.8 3.8  --   --  3.2* 3.9  CL 100 99  --   --  102 100  CO2  --  23  --   --  22 25  ANIONGAP  --  12  --   --  11 11  GLUCOSE 192* 185*  --   --  175* 145*  BUN 17 15  --   --  16 12  CREATININE 1.00 1.09*  --   --  1.28* 1.01*  AST  --  31  --   --  24  --   ALT  --  16  --   --  14  --   ALKPHOS  --  103  --   --  79  --   BILITOT  --  1.5*  --   --  1.2  --   ALBUMIN   --  3.2*  --   --  2.8*  --   LATICACIDVEN  --   --  2.3* 2.0*  --   --   INR  --  1.1  --   --   --   --   TSH  --  1.481  --   --   --   --   AMMONIA  --   --   --   --   --  25  BNP  --   --   --   --   --  744.1*  MG  --   --   --   --   --  1.8  CALCIUM   --  10.2  --   --  9.8 9.5      Recent Labs  Lab 12/31/23 2213 12/31/23 2223 01/01/24 0007 01/01/24 0414 01/02/24 0631  LATICACIDVEN  --  2.3* 2.0*  --   --   INR 1.1  --   --   --   --  TSH 1.481  --   --   --   --   AMMONIA  --   --   --   --  25  BNP  --   --   --   --  744.1*  MG  --   --   --   --  1.8  CALCIUM  10.2  --   --  9.8 9.5    --------------------------------------------------------------------------------------------------------------- Lab Results  Component Value Date   CHOL 135 05/23/2023   HDL 44 05/23/2023   LDLCALC 59 05/23/2023   TRIG 197 (H) 05/23/2023   CHOLHDL 3.1 05/23/2023    No results found for: HGBA1C Recent Labs    12/31/23 2213   TSH 1.481  FREET4 1.34*   No results for input(s): VITAMINB12, FOLATE, FERRITIN, TIBC, IRON, RETICCTPCT in the last 72 hours. ------------------------------------------------------------------------------------------------------------------ Cardiac Enzymes No results for input(s): CKMB, TROPONINI, MYOGLOBIN in the last 168 hours.  Invalid input(s): CK  Micro Results Recent Results (from the past 240 hours)  Blood culture (routine x 2)     Status: None (Preliminary result)   Collection Time: 12/31/23 10:25 PM   Specimen: BLOOD  Result Value Ref Range Status   Specimen Description BLOOD RIGHT ANTECUBITAL  Final   Special Requests   Final    BOTTLES DRAWN AEROBIC AND ANAEROBIC Blood Culture adequate volume   Culture  Setup Time   Final    GRAM NEGATIVE RODS IN BOTH AEROBIC AND ANAEROBIC BOTTLES CRITICAL VALUE NOTED.  VALUE IS CONSISTENT WITH PREVIOUSLY REPORTED AND CALLED VALUE. Performed at [facility], [address] [place], [place] [address]    Culture GRAM NEGATIVE RODS  Final   Report Status PENDING  Incomplete  Blood culture (routine x 2)     Status: None (Preliminary result)   Collection Time: 12/31/23 10:36 PM   Specimen: BLOOD LEFT HAND  Result Value Ref Range Status   Specimen Description BLOOD LEFT HAND  Final   Special Requests   Final    BOTTLES DRAWN AEROBIC AND ANAEROBIC Blood Culture results may not be optimal due to an inadequate volume of blood received in culture bottles   Culture  Setup Time   Final    GRAM NEGATIVE RODS IN BOTH AEROBIC AND ANAEROBIC BOTTLES CRITICAL RESULT CALLED TO, READ BACK BY AND VERIFIED WITH: PHARMD EMILY [id number] AT 1057, ADC Performed at [facility], [address], [place], [place] [address]    Culture GRAM NEGATIVE RODS  Final   Report Status PENDING  Incomplete  Blood Culture ID Panel (Reflexed)     Status: Abnormal   Collection Time: 12/31/23 10:36 PM  Result Value Ref Range Status    Enterococcus faecalis NOT DETECTED NOT DETECTED Final   Enterococcus Faecium NOT DETECTED NOT DETECTED Final   Listeria monocytogenes NOT DETECTED NOT DETECTED Final   Staphylococcus species NOT DETECTED NOT DETECTED Final   Staphylococcus aureus (BCID) NOT DETECTED NOT DETECTED Final   Staphylococcus epidermidis NOT DETECTED NOT DETECTED Final   Staphylococcus lugdunensis NOT DETECTED NOT DETECTED Final   Streptococcus species NOT DETECTED NOT DETECTED Final   Streptococcus agalactiae NOT DETECTED NOT DETECTED Final   Streptococcus pneumoniae NOT DETECTED NOT DETECTED Final   Streptococcus pyogenes NOT DETECTED NOT DETECTED Final   A.calcoaceticus-baumannii NOT DETECTED NOT DETECTED Final   Bacteroides fragilis NOT DETECTED NOT DETECTED Final   Enterobacterales DETECTED (A) NOT DETECTED Final    Comment: Enterobacterales represent a large order of gram negative  bacteria, not a single organism. CRITICAL RESULT CALLED TO, READ BACK BY AND VERIFIED WITH: PHARMD EMILY [id number] AT 1057, ADC    Enterobacter cloacae complex NOT DETECTED NOT DETECTED Final   Escherichia coli DETECTED (A) NOT DETECTED Final    Comment: CRITICAL RESULT CALLED TO, READ BACK BY AND VERIFIED WITH: PHARMD EMILY [id number] AT 1057, ADC    Klebsiella aerogenes NOT DETECTED NOT DETECTED Final   Klebsiella oxytoca NOT DETECTED NOT DETECTED Final   Klebsiella pneumoniae NOT DETECTED NOT DETECTED Final   Proteus species NOT DETECTED NOT DETECTED Final   Salmonella species NOT DETECTED NOT DETECTED Final   Serratia marcescens NOT DETECTED NOT DETECTED Final   Haemophilus influenzae NOT DETECTED NOT DETECTED Final   Neisseria meningitidis NOT DETECTED NOT DETECTED Final   Pseudomonas aeruginosa NOT DETECTED NOT DETECTED Final   Stenotrophomonas maltophilia NOT DETECTED NOT DETECTED Final   Candida albicans NOT DETECTED NOT DETECTED Final   Candida auris NOT DETECTED NOT DETECTED Final   Candida glabrata NOT DETECTED NOT  DETECTED Final   Candida krusei NOT DETECTED NOT DETECTED Final   Candida parapsilosis NOT DETECTED NOT DETECTED Final   Candida tropicalis NOT DETECTED NOT DETECTED Final   Cryptococcus neoformans/gattii NOT DETECTED NOT DETECTED Final   CTX-M ESBL NOT DETECTED NOT DETECTED Final   Carbapenem resistance IMP NOT DETECTED NOT DETECTED Final   Carbapenem resistance KPC NOT DETECTED NOT DETECTED Final   Carbapenem resistance NDM NOT DETECTED NOT DETECTED Final   Carbapenem resist OXA 48 LIKE NOT DETECTED NOT DETECTED Final   Carbapenem resistance VIM NOT DETECTED NOT DETECTED Final    Comment: Performed at [facility], [address], [place], [place] [address]  Urine Culture     Status: Abnormal   Collection Time: 12/31/23 10:42 PM   Specimen: Urine, Random  Result Value Ref Range Status   Specimen Description URINE, RANDOM  Final   Special Requests   Final    NONE Reflexed from [id number] Performed at [facility], [address] [place], [place] [address]    Culture 20,000 COLONIES/mL ESCHERICHIA COLI (A)  Final   Report Status 01/02/2024 FINAL  Final   Organism ID, Bacteria ESCHERICHIA COLI (A)  Final      Susceptibility   Escherichia coli - MIC*    AMPICILLIN <=2 SENSITIVE Sensitive     CEFAZOLIN  <=4 SENSITIVE Sensitive     CEFEPIME  <=0.12 SENSITIVE Sensitive     CEFTRIAXONE  <=0.25 SENSITIVE Sensitive     CIPROFLOXACIN  <=0.25 SENSITIVE Sensitive     GENTAMICIN <=1 SENSITIVE Sensitive     IMIPENEM <=0.25 SENSITIVE Sensitive     NITROFURANTOIN <=16 SENSITIVE Sensitive     TRIMETH/SULFA <=20 SENSITIVE Sensitive     AMPICILLIN/SULBACTAM <=2 SENSITIVE Sensitive     PIP/TAZO <=4 SENSITIVE Sensitive ug/mL    * 20,000 COLONIES/mL ESCHERICHIA COLI  Resp panel by RT-PCR (RSV, Flu A&B, Covid) Urine, Catheterized     Status: None   Collection Time: 12/31/23 10:47 PM   Specimen: Urine, Catheterized; Nasal Swab  Result Value Ref Range Status   SARS Coronavirus 2 by RT PCR  NEGATIVE NEGATIVE Final   Influenza A by PCR NEGATIVE NEGATIVE Final   Influenza B by PCR NEGATIVE NEGATIVE Final    Comment: (NOTE) The Xpert Xpress SARS-CoV-2/FLU/RSV plus assay is intended as an aid in the diagnosis of influenza from Nasopharyngeal swab specimens and should not be used as a sole basis for treatment. Nasal washings and aspirates  are unacceptable for Xpert Xpress SARS-CoV-2/FLU/RSV testing.  Fact Sheet for Patients: BloggerCourse.com  Fact Sheet for Healthcare Providers: SeriousBroker.it  This test is not yet approved or cleared by the [place]  FDA and has been authorized for detection and/or diagnosis of SARS-CoV-2 by FDA under an Emergency Use Authorization (EUA). This EUA will remain in effect (meaning this test can be used) for the duration of the COVID-19 declaration under Section 564(b)(1) of the Act, 21 U.S.C. section 360bbb-3(b)(1), unless the authorization is terminated or revoked.     Resp Syncytial Virus by PCR NEGATIVE NEGATIVE Final    Comment: (NOTE) Fact Sheet for Patients: BloggerCourse.com  Fact Sheet for Healthcare Providers: SeriousBroker.it  This test is not yet approved or cleared by the [place]  FDA and has been authorized for detection and/or diagnosis of SARS-CoV-2 by FDA under an Emergency Use Authorization (EUA). This EUA will remain in effect (meaning this test can be used) for the duration of the COVID-19 declaration under Section 564(b)(1) of the Act, 21 U.S.C. section 360bbb-3(b)(1), unless the authorization is terminated or revoked.  Performed at [facility], [address], [place], [place] [address]   MRSA Next Gen by PCR, Nasal     Status: None   Collection Time: 01/01/24 12:53 AM   Specimen: Nasal Mucosa; Nasal Swab  Result Value Ref Range Status   MRSA by PCR Next Gen NOT DETECTED NOT DETECTED Final     Comment: (NOTE) The GeneXpert MRSA Assay (FDA approved for NASAL specimens only), is one component of a comprehensive MRSA colonization surveillance program. It is not intended to diagnose MRSA infection nor to guide or monitor treatment for MRSA infections. Test performance is not FDA approved in patients less than 12 years old. Performed at [facility], [address] [place], [place] [address]     Radiology Reports  EEG adult Result Date: 01/01/2024 Gregg Lek, MD     01/01/2024  5:21 PM Patient Name: Christina Melendez MRN: [id number] Epilepsy Attending: Lek Gregg Referring Physician/Provider: No ref. provider found     Date: 01/01/2024 Duration: 23 minutes Patient history: 78 y.o. female with medical history significant of CAD, HOCM, hyperlipidemia, anxiety/depression, arthritis, GERD presenting to the ED via EMS as code stroke for evaluation of altered mental status. EEG to evaluate for seizure Level of alertness: Awake AEDs during EEG study: Technical aspects: This EEG study was done with scalp electrodes positioned according to the 10-20 International system of electrode placement. Electrical activity was reviewed with band pass filter of 1-70Hz , sensitivity of 7 uV/mm, display speed of 83mm/sec with a 60Hz  notched filter applied as appropriate. EEG data were recorded continuously and digitally stored.  Video monitoring was available and reviewed as appropriate. Description: The posterior dominant rhythm consists of 7-8 Hz activity of moderate voltage (25-35 uV) seen predominantly in posterior head regions, symmetric and reactive to eye opening and eye closing. Drowsiness was not seen. Sleep was not seen.   Hyperventilation and photic stimulation were not performed.   ABNORMALITY - Intermittent slow, generalized IMPRESSION: This study is suggestive of mild diffuse encephalopathy, nonspecific etiology but likely related to sedation, toxic-metabolic etiology. No seizures or  epileptiform discharges were seen throughout the recording. Lek Gregg MD Neurology    CT HEAD WO CONTRAST ( ) Result Date: 01/01/2024 CLINICAL DATA:  Provided history: Stroke, follow-up. EXAM: CT HEAD WITHOUT CONTRAST TECHNIQUE: Contiguous axial images were obtained from the base of the skull through the vertex without intravenous contrast. RADIATION DOSE  REDUCTION: This exam was performed according to the departmental dose-optimization program which includes automated exposure control, adjustment of the mA and/or kV according to patient size and/or use of iterative reconstruction technique. COMPARISON:  Non-contrast head CT and CT angiogram head/neck 12/31/2023. FINDINGS: Brain: Generalized cerebral and cerebellar atrophy. Patchy and ill-defined hypoattenuation within the cerebral white matter, nonspecific but compatible with moderate-to-advanced chronic small vessel ischemic disease. There is no acute intracranial hemorrhage. No demarcated cortical infarct. No extra-axial fluid collection. No evidence of an intracranial mass. No midline shift. Vascular: No hyperdense vessel. Atherosclerotic calcifications. Skull: No calvarial fracture or aggressive osseous lesion. Sinuses/Orbits: No mass or acute finding within the imaged orbits. No significant paranasal sinus disease. Other: Small-volume fluid within bilateral mastoid air cells. IMPRESSION: 1.  No evidence of an acute intracranial abnormality. 2. Parenchymal atrophy and chronic small vessel ischemic disease. 3. Small-volume fluid within bilateral mastoid air cells. Electronically Signed   By: Rockey Childs D.O.   On: 01/01/2024 10:11   CT ANGIO HEAD NECK W WO CM (CODE STROKE) Result Date: 12/31/2023 CLINICAL DATA:  Neuro deficit, acute, stroke suspected EXAM: CT ANGIOGRAPHY HEAD AND NECK WITH AND WITHOUT CONTRAST TECHNIQUE: Multidetector CT imaging of the head and neck was performed using the standard protocol during bolus administration of intravenous  contrast. Multiplanar CT image reconstructions and MIPs were obtained to evaluate the vascular anatomy. Carotid stenosis measurements (when applicable) are obtained utilizing NASCET criteria, using the distal internal carotid diameter as the denominator. RADIATION DOSE REDUCTION: This exam was performed according to the departmental dose-optimization program which includes automated exposure control, adjustment of the mA and/or kV according to patient size and/or use of iterative reconstruction technique. CONTRAST:  60mL OMNIPAQUE  IOHEXOL  350 MG/ML SOLN COMPARISON:  None Available. FINDINGS: CTA NECK FINDINGS Aortic arch: Great vessel origins are patent a significant stenosis. Right carotid system: Atherosclerosis at the carotid bifurcation and involving the proximal ICA with approximately 50% stenosis. Left carotid system: Atherosclerosis at the carotid bifurcation involving the proximal ICA with approximately 40% stenosis. Vertebral arteries: Codominant. No evidence of dissection, stenosis (50% or greater), or occlusion. Skeleton: No acute abnormality on limited assessment. Other neck: No acute abnormality on limited assessment. Upper chest: Lung apices are clear. Review of the MIP images confirms the above findings CTA HEAD FINDINGS Anterior circulation: Bilateral intracranial ICAs, MCAs, and ACAs are patent. Moderate right A2 ACA stenosis. Posterior circulation: Bilateral intradural vertebral arteries, basilar artery and bilateral posterior cerebral arteries are patent. Mild multifocal stenosis of the PCAs bilaterally. Venous sinuses: As permitted by contrast timing, patent. Review of the MIP images confirms the above findings IMPRESSION: 1. No emergent large vessel occlusion. 2. Approximately 50% stenosis of the proximal right ICA and 40% stenosis of the proximal left ICA. 3. Moderate right A2 ACA stenosis. Electronically Signed   By: Gilmore GORMAN Molt M.D.   On: 12/31/2023 22:37   DG Chest Portable 1  View Result Date: 12/31/2023 CLINICAL DATA:  Fever EXAM: PORTABLE CHEST 1 VIEW COMPARISON:  03/27/2023 FINDINGS: Heart and mediastinal contours are within normal limits. Patchy opacities throughout the left lung and in the right upper lobe concerning for pneumonia. No effusions. No acute bony abnormality. IMPRESSION: Patchy bilateral opacities, left greater than right concerning for pneumonia. Electronically Signed   By: Franky Crease M.D.   On: 12/31/2023 22:35   CT HEAD CODE STROKE WO CONTRAST Result Date: 12/31/2023 CLINICAL DATA:  Code stroke.  Neuro deficit, acute, stroke suspected EXAM: CT HEAD WITHOUT CONTRAST TECHNIQUE: Contiguous axial  images were obtained from the base of the skull through the vertex without intravenous contrast. RADIATION DOSE REDUCTION: This exam was performed according to the departmental dose-optimization program which includes automated exposure control, adjustment of the mA and/or kV according to patient size and/or use of iterative reconstruction technique. COMPARISON:  CT head Oct 12, 2020. FINDINGS: Brain: No evidence of acute infarction, hemorrhage, hydrocephalus, extra-axial collection or mass lesion/mass effect. Patchy white matter hypodensities are compatible with chronic microvascular ischemic disease. Cerebral atrophy. Vascular: No hyperdense vessel. Skull: No acute fracture. Sinuses/Orbits: Clear sinuses.  No acute orbital findings. Other: No mastoid effusions. ASPECTS [facility] Stroke Program Early CT Score) Total score (0-10 with 10 being normal): 10. IMPRESSION: 1. No evidence of acute intracranial abnormality.  ASPECTS is 10. 2. Chronic microvascular ischemic disease. Code stroke imaging results were communicated on 12/31/2023 at 10:21 pm to provider Dr. Michaela via secure text paging. Electronically Signed   By: Gilmore GORMAN Molt M.D.   On: 12/31/2023 22:21      Signature  -   Lavada Stank M.D on 01/02/2024 at 8:50 AM   -  To page go to [email or web address]

## 2024-01-02 NOTE — Evaluation (Signed)
 Physical Therapy Evaluation Patient Details Name: Christina Melendez MRN: [id number] DOB: Sep 06, 1945 Today's Date: 01/02/2024  History of Present Illness  78 y.o. female  presenting to the ED via EMS as code stroke for evaluation of altered mental status and acute onset right-sided weakness, symptoms improved now, found to have E. coli bacteremia and likely source appears to be urinary tract infection and she is currently on intravenous antibiotics. Past medical history significant of CAD, HOCM, hyperlipidemia, anxiety/depression, arthritis, GERD.  Clinical Impression  Pt presents with admitting diagnosis above. Pt today was able to ambulate short distance in room with +2 Max A HHA. PTA pt reports that was fully independent however no family present to confirm this. Anticipate that pt will be able to tolerate HHPT once cognition clears if spouse is able to provide 24 hour supervision. PT will continue to follow.         If plan is discharge home, recommend the following: A little help with walking and/or transfers;A little help with bathing/dressing/bathroom;Assistance with cooking/housework;Direct supervision/assist for medications management;Assist for transportation;Help with stairs or ramp for entrance;Supervision due to cognitive status   Can travel by private vehicle        Equipment Recommendations Rolling walker (2 wheels)  Recommendations for Other Services  OT consult    Functional Status Assessment Patient has had a recent decline in their functional status and demonstrates the ability to make significant improvements in function in a reasonable and predictable amount of time.     Precautions / Restrictions Precautions Precautions: Fall Recall of Precautions/Restrictions: Impaired Restrictions Weight Bearing Restrictions Per Provider Order: No      Mobility  Bed Mobility Overal bed mobility: Needs Assistance Bed Mobility: Supine to Sit     Supine to sit: Min assist      General bed mobility comments: Min A for trunk elevation due to posterior lean. Constant cues for sequencing.    Transfers Overall transfer level: Needs assistance Equipment used: 2 person hand held assist Transfers: Sit to/from Stand Sit to Stand: +2 physical assistance, Min assist           General transfer comment: +2 Min A to stand.    Ambulation/Gait Ambulation/Gait assistance: +2 physical assistance, Min assist Gait Distance (Feet): 10 Feet Assistive device: 2 person hand held assist Gait Pattern/deviations: Trunk flexed, Decreased stride length, Step-through pattern, Narrow base of support Gait velocity: decreased     General Gait Details: +2 Min A via HHA to ambulate around bed to chair.  Stairs            Wheelchair Mobility     Tilt Bed    Modified Rankin (Stroke Patients Only)       Balance Overall balance assessment: Needs assistance Sitting-balance support: Bilateral upper extremity supported, Feet supported Sitting balance-Leahy Scale: Poor Sitting balance - Comments: Intermittent posterior lean Postural control: Posterior lean Standing balance support: Bilateral upper extremity supported, During functional activity Standing balance-Leahy Scale: Poor Standing balance comment: Reliant on external support                             Pertinent Vitals/Pain Pain Assessment Pain Assessment: No/denies pain    Home Living Family/patient expects to be discharged to:: Private residence Living Arrangements: Spouse/significant other Available Help at Discharge: Family;Available 24 hours/day Type of Home: House ([place]house) Home Access: Level entry       Home Layout: One level Home Equipment: Shower seat;Grab bars - toilet;Grab  bars - tub/shower;Hand held shower head      Prior Function Prior Level of Function : Independent/Modified Independent;Driving             Mobility Comments: Ind ADLs Comments: Ind      Extremity/Trunk Assessment   Upper Extremity Assessment Upper Extremity Assessment: Overall WFL for tasks assessed    Lower Extremity Assessment Lower Extremity Assessment: Overall WFL for tasks assessed    Cervical / Trunk Assessment Cervical / Trunk Assessment: Normal  Communication   Communication Communication: No apparent difficulties    Cognition Arousal: Alert Behavior During Therapy: WFL for tasks assessed/performed   PT - Cognitive impairments: Problem solving, Safety/Judgement, Sequencing, Initiation                       PT - Cognition Comments: Poor safety awareness with difficulty sequencing and problem solving. Following commands: Impaired Following commands impaired: Follows one step commands inconsistently, Follows one step commands with increased time     Cueing Cueing Techniques: Verbal cues, Tactile cues     General Comments General comments (skin integrity, edema, etc.): VSS    Exercises     Assessment/Plan    PT Assessment Patient needs continued PT services  PT Problem List Decreased strength;Decreased range of motion;Decreased activity tolerance;Decreased balance;Decreased mobility;Decreased coordination;Decreased cognition;Decreased knowledge of use of DME;Decreased safety awareness;Decreased knowledge of precautions;Cardiopulmonary status limiting activity       PT Treatment Interventions DME instruction;Gait training;Stair training;Functional mobility training;Therapeutic activities;Therapeutic exercise;Balance training;Neuromuscular re-education;Cognitive remediation;Patient/family education    PT Goals (Current goals can be found in the Care Plan section)  Acute Rehab PT Goals Patient Stated Goal: to go home PT Goal Formulation: With patient Time For Goal Achievement: 01/16/24 Potential to Achieve Goals: Fair    Frequency Min 2X/week     Co-evaluation               AM-PAC PT 6 Clicks Mobility  Outcome  Measure Help needed turning from your back to your side while in a flat bed without using bedrails?: A Little Help needed moving from lying on your back to sitting on the side of a flat bed without using bedrails?: A Little Help needed moving to and from a bed to a chair (including a wheelchair)?: A Little Help needed standing up from a chair using your arms (e.g., wheelchair or bedside chair)?: A Little Help needed to walk in hospital room?: A Little Help needed climbing 3-5 steps with a railing? : A Lot 6 Click Score: 17    End of Session Equipment Utilized During Treatment: Gait belt Activity Tolerance: Patient tolerated treatment well Patient left: in chair;with call bell/phone within reach;with chair alarm set Nurse Communication: Mobility status PT Visit Diagnosis: Other abnormalities of gait and mobility (R26.89)    Time: 9065-9045 PT Time Calculation (min) (ACUTE ONLY): 20 min   Charges:   PT Evaluation $PT Eval Moderate Complexity: 1 Mod   PT General Charges $$ ACUTE PT VISIT: 1 Visit         Sueellen NOVAK, PT, DPT Acute Rehab Services [phone]   Virgil Slinger 01/02/2024, 4:05 PM

## 2024-01-03 ENCOUNTER — Inpatient Hospital Stay (HOSPITAL_COMMUNITY)

## 2024-01-03 ENCOUNTER — Other Ambulatory Visit (HOSPITAL_COMMUNITY): Payer: Self-pay

## 2024-01-03 DIAGNOSIS — Z90710 Acquired absence of both cervix and uterus: Secondary | ICD-10-CM | POA: Diagnosis not present

## 2024-01-03 DIAGNOSIS — A419 Sepsis, unspecified organism: Secondary | ICD-10-CM | POA: Diagnosis not present

## 2024-01-03 DIAGNOSIS — G9340 Encephalopathy, unspecified: Secondary | ICD-10-CM | POA: Diagnosis not present

## 2024-01-03 LAB — CBC WITH DIFFERENTIAL/PLATELET
Abs Immature Granulocytes: 0.08 K/uL — ABNORMAL HIGH (ref 0.00–0.07)
Basophils Absolute: 0 K/uL (ref 0.0–0.1)
Basophils Relative: 0 %
Eosinophils Absolute: 0.2 K/uL (ref 0.0–0.5)
Eosinophils Relative: 2 %
HCT: 41.1 % (ref 36.0–46.0)
Hemoglobin: 13.9 g/dL (ref 12.0–15.0)
Immature Granulocytes: 1 %
Lymphocytes Relative: 7 %
Lymphs Abs: 0.9 K/uL (ref 0.7–4.0)
MCH: 31.4 pg (ref 26.0–34.0)
MCHC: 33.8 g/dL (ref 30.0–36.0)
MCV: 93 fL (ref 80.0–100.0)
Monocytes Absolute: 1.2 K/uL — ABNORMAL HIGH (ref 0.1–1.0)
Monocytes Relative: 9 %
Neutro Abs: 10.4 K/uL — ABNORMAL HIGH (ref 1.7–7.7)
Neutrophils Relative %: 81 %
Platelets: 194 K/uL (ref 150–400)
RBC: 4.42 MIL/uL (ref 3.87–5.11)
RDW: 12.3 % (ref 11.5–15.5)
WBC: 12.8 K/uL — ABNORMAL HIGH (ref 4.0–10.5)
nRBC: 0 % (ref 0.0–0.2)

## 2024-01-03 LAB — CULTURE, BLOOD (ROUTINE X 2): Special Requests: ADEQUATE

## 2024-01-03 LAB — BASIC METABOLIC PANEL WITH GFR
Anion gap: 10 (ref 5–15)
BUN: 10 mg/dL (ref 8–23)
CO2: 25 mmol/L (ref 22–32)
Calcium: 9.8 mg/dL (ref 8.9–10.3)
Chloride: 102 mmol/L (ref 98–111)
Creatinine, Ser: 0.8 mg/dL (ref 0.44–1.00)
GFR, Estimated: >60 mL/min (ref >60)
Glucose, Bld: 126 mg/dL — ABNORMAL HIGH (ref 70–99)
Potassium: 3.6 mmol/L (ref 3.5–5.1)
Sodium: 137 mmol/L (ref 135–145)

## 2024-01-03 LAB — PROCALCITONIN: Procalcitonin: 11.57 ng/mL

## 2024-01-03 LAB — MAGNESIUM: Magnesium: 2 mg/dL (ref 1.7–2.4)

## 2024-01-03 LAB — C-REACTIVE PROTEIN: CRP: 32.5 mg/dL — ABNORMAL HIGH (ref <1.0)

## 2024-01-03 MED ORDER — CEPHALEXIN 500 MG PO CAPS
500.0000 mg | ORAL_CAPSULE | Freq: Three times a day (TID) | ORAL | 0 refills | Status: AC
Start: 2024-01-03 — End: 2024-01-10
  Filled 2024-01-03: qty 21, 7d supply, fill #0

## 2024-01-03 NOTE — Discharge Summary (Addendum)
 Christina  Vaughn Melendez FMW:[id number] DOB: 11/06/1945 DOA: 12/31/2023  PCP: Marvene Prentice SAUNDERS, FNP  Admit date: 12/31/2023  Discharge date: 01/03/2024  Admitted From: Home   Disposition:  Home   Recommendations for Outpatient Follow-up:   Follow up with PCP in 1-2 weeks  PCP Please obtain BMP/CBC, 2 view CXR in 1week,  (see Discharge instructions)   PCP Please follow up on the following pending results:    Home Health: None   Equipment/Devices: walker (has per spouse)  Consultations: None  Discharge Condition: Stable    CODE STATUS: Full    Diet Recommendation: Heart Healthy     Chief Complaint  Patient presents with   Code Stroke     Brief history of present illness from the day of admission and additional interim summary    78 y.o. female with medical history significant of CAD, HOCM, hyperlipidemia, anxiety/depression, arthritis, GERD presenting to the ED via EMS as code stroke for evaluation of altered mental status and acute onset right-sided weakness, symptoms improved now, found to have E. coli bacteremia and likely source appears to be urinary tract infection and she is currently on intravenous antibiotics.                                                                  Hospital Course   Acute metabolic encephalopathy : Resolving due to sepsis caused by E. coli bacteremia source UTI present on admission.   She was treated with supportive care, sepsis pathophysiology has resolved, mental status close to baseline, head CT, EEG and CTA unremarkable, no headache or focal deficits.  Continue supportive care.   Severe sepsis and with E. coli bacteremia source UTI and less likely community-acquired pneumonia : Present on admission, patient on IV fluids along with IV Rocephin  with good results, sepsis  pathophysiology has completely resolved, she is stable and symptom-free, urine also growing E. coli source most likely is UTI and not pneumonia, no cough or shortness of breath currently, will be placed on 7 more days of oral Keflex  with discharge and outpatient PCP follow-up.  He is completely symptom-free and eager to go home, since CRP still high will check CT A&P prior to DC rule out ureter stone etc. >> stable.   CAD Elevated troponin/type II MI EKG showing T wave inversions in lateral leads, no significant change compared to previous EKGs.  Troponin 23>41.  Trend flat and in non-ACS pattern, no acute issues no chest pain.  Postdischarge follow-up with PCP and primary cardiology.   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  Compensated no acute issue.  Postdischarge follow-up with PCP and cardiology as needed.  Hyperlipidemia Continue Crestor .   GERD Continue PPI.  Non specific CTA head and neck changes - ASA- statin, PCP to monitor.   Discharge diagnosis     Principal Problem:   Acute encephalopathy Active Problems:   CAD (coronary artery disease)   Severe sepsis (HCC)   Acute hypoxemic respiratory failure (HCC)   Hyponatremia    Discharge instructions    Discharge Instructions     Diet - low sodium heart healthy   Complete by: As directed    Discharge instructions   Complete by: As directed    Follow with Primary MD Marvene Prentice SAUNDERS, FNP in 7 days, review your CT scans with your PCP in detail  Get CBC, CMP, Magnesium  -  checked next visit with your primary MD   Activity: As tolerated with Full fall precautions use walker/cane & assistance as needed  Disposition Home    Diet: Heart Healthy   Special Instructions: If you have smoked or chewed Tobacco  in the last 2 yrs please stop smoking, stop any regular  Alcohol  and or any Recreational drug use.  On your next visit with your primary care physician please Get Medicines reviewed and adjusted.  Please request your Prim.MD to go over all Hospital Tests and Procedure/Radiological results at the follow up, please get all Hospital records sent to your Prim MD by signing hospital release before you go home.  If you experience worsening of your admission symptoms, develop shortness of breath, life threatening emergency, suicidal or homicidal thoughts you must seek medical attention immediately by calling 911 or calling your MD immediately  if symptoms less severe.  You Must read complete instructions/literature along with all the possible adverse reactions/side effects for all the Medicines you take and that have been prescribed to you. Take any new Medicines after you have completely understood and accpet all the possible adverse reactions/side effects.   Do not drive when taking Pain medications.  Do not take more than prescribed Pain, Sleep and Anxiety Medications  Wear Seat belts while driving.   Increase activity slowly   Complete by: As directed        Discharge Medications   Allergies as of 01/03/2024   No Known Allergies      Medication List     TAKE these medications    aspirin  EC 81 MG tablet Take 81 mg by mouth daily. Swallow whole.   CALCIUM  + D3 PO Take 1 tablet by mouth in the morning and at bedtime.   cephALEXin  500 MG capsule Commonly known as: KEFLEX  Take 1 capsule (500 mg total) by mouth 3 (three) times daily for 7 days.   esomeprazole 20 MG capsule Commonly known as: NEXIUM Take 20 mg by mouth daily.   estradiol  0.5 MG tablet Commonly known as: ESTRACE  Take 0.5 mg by mouth daily.   FLUoxetine  20 MG capsule Commonly known as: PROZAC  Take 20 mg by mouth daily.   furosemide  20 MG tablet Commonly known as: LASIX  Take 1 tablet (20 mg total) by mouth every Monday, Wednesday, and Friday.   meclizine  25 MG  tablet Commonly known as: ANTIVERT  Take 50 mg by mouth 2 (two) times daily.   metoprolol  tartrate 25 MG tablet Commonly known as: LOPRESSOR  Take 1 tablet (25 mg total) by mouth 2 (two) times daily.   ondansetron  4 MG tablet Commonly known as: ZOFRAN  Take 4 mg by mouth every 8 (eight) hours as needed for nausea or vomiting.   rosuvastatin  10 MG tablet  Commonly known as: CRESTOR  Take 1 tablet (10 mg total) by mouth daily.   temazepam  30 MG capsule Commonly known as: RESTORIL  Take 30 mg by mouth at bedtime.   traMADol 50 MG tablet Commonly known as: ULTRAM Take 50-100 mg by mouth See admin instructions. Take 2 tablets by mouth in the morning and 1 tablet at night         Follow-up Information     Marvene Prentice SAUNDERS, FNP. Schedule an appointment as soon as possible for a visit in 1 week(s).   Specialty: Family Medicine Contact information: [address] [place] [address] [phone]         [facility]. Schedule an appointment as soon as possible for a visit in 2 week(s).   Contact information: [address]     [address] [place]  [address] [phone]                Major procedures and Radiology Reports - PLEASE review detailed and final reports thoroughly  -      CT ABDOMEN PELVIS WO CONTRAST Result Date: 01/03/2024 EXAM: CT ABDOMEN AND PELVIS WITHOUT CONTRAST 01/03/2024 11:04:13 AM TECHNIQUE: CT of the abdomen and pelvis was performed without the administration of intravenous contrast. Multiplanar reformatted images are provided for review. Automated exposure control, iterative reconstruction, and/or weight based adjustment of the mA/kV was utilized to reduce the radiation dose to as low as reasonably achievable. COMPARISON: 12/23/2018 CLINICAL HISTORY: Sepsis. FINDINGS: LOWER CHEST: Dependent changes with trace pleural effusion and pleural thickening overlying the posterior lung bases. Tiny subpleural nodule  overlying the lateral aspect of the right upper lobe measures 2 mm and is unchanged from the previous exam, compatible with a benign nodule. LIVER: The liver is unremarkable. GALLBLADDER AND BILE DUCTS: Gallbladder is unremarkable. No biliary ductal dilatation. SPLEEN: No acute abnormality. PANCREAS: No acute abnormality. ADRENAL GLANDS: No acute abnormality. KIDNEYS, URETERS AND BLADDER: Mild bilateral perinephric fat stranding. No signs of obstructive uropathy or urinary tract calculi. The bladder appears normal. GI AND BOWEL: Nonvisualization of the appendix without secondary signs of acute appendicitis. No pathologic dilatation of the large or small bowel loops. Mild stool burden noted within the colon. Sigmoid diverticulosis without signs of acute diverticulitis. PERITONEUM AND RETROPERITONEUM: No free fluid or fluid collections. No free air. VASCULATURE: Aortic atherosclerotic calcification. LYMPH NODES: No signs of abdominal or pelvic adenopathy. REPRODUCTIVE ORGANS: Status post hysterectomy. No adnexal mass identified. BONES AND SOFT TISSUES: Small fat-containing umbilical hernia. Lumbar degenerative disc disease. No acute fracture or subluxation. IMPRESSION: 1. No specific acute findings in the abdomen or pelvis related to sepsis. 2. Mild bilateral Perinephric fat stranding is new compared with the previous exam without signs of urinary tract calculi or obstructive uropathy. This is a nonspecific finding but may be seen with urinary tract infection. Clinical correlation is advised Electronically signed by: Waddell Calk MD 01/03/2024 11:16 AM EDT RP Workstation: [id number]   DG Chest Port 1 View Result Date: 01/02/2024 CLINICAL DATA:  [id number] SOB (shortness of breath) [id number] EXAM: DG CHEST 1V PORT COMPARISON:  December 01, 2023 FINDINGS: Lower lung volumes. Decreasing patchy opacities throughout the lungs. No pneumothorax or pleural effusion. No cardiomegaly. No acute fracture or destructive lesion. Multilevel  thoracic osteophytosis. IMPRESSION: Decreasing patchy airspace opacities throughout the lungs. No pleural effusion or pneumothorax. Electronically Signed   By: Rogelia Myers M.D.   On: 01/02/2024 08:56   EEG adult Result Date: 01/01/2024 Gregg Lek, MD  01/01/2024  5:21 PM Patient Name: Savina Olshefski MRN: [id number] Epilepsy Attending: Pastor Falling Referring Physician/Provider: No ref. provider found     Date: 01/01/2024 Duration: 23 minutes Patient history: 78 y.o. female with medical history significant of CAD, HOCM, hyperlipidemia, anxiety/depression, arthritis, GERD presenting to the ED via EMS as code stroke for evaluation of altered mental status. EEG to evaluate for seizure Level of alertness: Awake AEDs during EEG study: Technical aspects: This EEG study was done with scalp electrodes positioned according to the 10-20 International system of electrode placement. Electrical activity was reviewed with band pass filter of 1-70Hz , sensitivity of 7 uV/mm, display speed of 76mm/sec with a 60Hz  notched filter applied as appropriate. EEG data were recorded continuously and digitally stored.  Video monitoring was available and reviewed as appropriate. Description: The posterior dominant rhythm consists of 7-8 Hz activity of moderate voltage (25-35 uV) seen predominantly in posterior head regions, symmetric and reactive to eye opening and eye closing. Drowsiness was not seen. Sleep was not seen.   Hyperventilation and photic stimulation were not performed.   ABNORMALITY - Intermittent slow, generalized IMPRESSION: This study is suggestive of mild diffuse encephalopathy, nonspecific etiology but likely related to sedation, toxic-metabolic etiology. No seizures or epileptiform discharges were seen throughout the recording. Pastor Falling MD Neurology    CT HEAD WO CONTRAST ( ) Result Date: 01/01/2024 CLINICAL DATA:  Provided history: Stroke, follow-up. EXAM: CT HEAD WITHOUT CONTRAST TECHNIQUE:  Contiguous axial images were obtained from the base of the skull through the vertex without intravenous contrast. RADIATION DOSE REDUCTION: This exam was performed according to the departmental dose-optimization program which includes automated exposure control, adjustment of the mA and/or kV according to patient size and/or use of iterative reconstruction technique. COMPARISON:  Non-contrast head CT and CT angiogram head/neck 12/31/2023. FINDINGS: Brain: Generalized cerebral and cerebellar atrophy. Patchy and ill-defined hypoattenuation within the cerebral white matter, nonspecific but compatible with moderate-to-advanced chronic small vessel ischemic disease. There is no acute intracranial hemorrhage. No demarcated cortical infarct. No extra-axial fluid collection. No evidence of an intracranial mass. No midline shift. Vascular: No hyperdense vessel. Atherosclerotic calcifications. Skull: No calvarial fracture or aggressive osseous lesion. Sinuses/Orbits: No mass or acute finding within the imaged orbits. No significant paranasal sinus disease. Other: Small-volume fluid within bilateral mastoid air cells. IMPRESSION: 1.  No evidence of an acute intracranial abnormality. 2. Parenchymal atrophy and chronic small vessel ischemic disease. 3. Small-volume fluid within bilateral mastoid air cells. Electronically Signed   By: Rockey Childs D.O.   On: 01/01/2024 10:11   CT ANGIO HEAD NECK W WO CM (CODE STROKE) Result Date: 12/31/2023 CLINICAL DATA:  Neuro deficit, acute, stroke suspected EXAM: CT ANGIOGRAPHY HEAD AND NECK WITH AND WITHOUT CONTRAST TECHNIQUE: Multidetector CT imaging of the head and neck was performed using the standard protocol during bolus administration of intravenous contrast. Multiplanar CT image reconstructions and MIPs were obtained to evaluate the vascular anatomy. Carotid stenosis measurements (when applicable) are obtained utilizing NASCET criteria, using the distal internal carotid diameter as  the denominator. RADIATION DOSE REDUCTION: This exam was performed according to the departmental dose-optimization program which includes automated exposure control, adjustment of the mA and/or kV according to patient size and/or use of iterative reconstruction technique. CONTRAST:  60mL OMNIPAQUE  IOHEXOL  350 MG/ML SOLN COMPARISON:  None Available. FINDINGS: CTA NECK FINDINGS Aortic arch: Great vessel origins are patent a significant stenosis. Right carotid system: Atherosclerosis at the carotid bifurcation and involving the proximal ICA with approximately 50% stenosis. Left  carotid system: Atherosclerosis at the carotid bifurcation involving the proximal ICA with approximately 40% stenosis. Vertebral arteries: Codominant. No evidence of dissection, stenosis (50% or greater), or occlusion. Skeleton: No acute abnormality on limited assessment. Other neck: No acute abnormality on limited assessment. Upper chest: Lung apices are clear. Review of the MIP images confirms the above findings CTA HEAD FINDINGS Anterior circulation: Bilateral intracranial ICAs, MCAs, and ACAs are patent. Moderate right A2 ACA stenosis. Posterior circulation: Bilateral intradural vertebral arteries, basilar artery and bilateral posterior cerebral arteries are patent. Mild multifocal stenosis of the PCAs bilaterally. Venous sinuses: As permitted by contrast timing, patent. Review of the MIP images confirms the above findings IMPRESSION: 1. No emergent large vessel occlusion. 2. Approximately 50% stenosis of the proximal right ICA and 40% stenosis of the proximal left ICA. 3. Moderate right A2 ACA stenosis. Electronically Signed   By: Gilmore GORMAN Molt M.D.   On: 12/31/2023 22:37   DG Chest Portable 1 View Result Date: 12/31/2023 CLINICAL DATA:  Fever EXAM: PORTABLE CHEST 1 VIEW COMPARISON:  03/27/2023 FINDINGS: Heart and mediastinal contours are within normal limits. Patchy opacities throughout the left lung and in the right upper lobe  concerning for pneumonia. No effusions. No acute bony abnormality. IMPRESSION: Patchy bilateral opacities, left greater than right concerning for pneumonia. Electronically Signed   By: Franky Crease M.D.   On: 12/31/2023 22:35   CT HEAD CODE STROKE WO CONTRAST Result Date: 12/31/2023 CLINICAL DATA:  Code stroke.  Neuro deficit, acute, stroke suspected EXAM: CT HEAD WITHOUT CONTRAST TECHNIQUE: Contiguous axial images were obtained from the base of the skull through the vertex without intravenous contrast. RADIATION DOSE REDUCTION: This exam was performed according to the departmental dose-optimization program which includes automated exposure control, adjustment of the mA and/or kV according to patient size and/or use of iterative reconstruction technique. COMPARISON:  CT head Oct 12, 2020. FINDINGS: Brain: No evidence of acute infarction, hemorrhage, hydrocephalus, extra-axial collection or mass lesion/mass effect. Patchy white matter hypodensities are compatible with chronic microvascular ischemic disease. Cerebral atrophy. Vascular: No hyperdense vessel. Skull: No acute fracture. Sinuses/Orbits: Clear sinuses.  No acute orbital findings. Other: No mastoid effusions. ASPECTS [facility] Stroke Program Early CT Score) Total score (0-10 with 10 being normal): 10. IMPRESSION: 1. No evidence of acute intracranial abnormality.  ASPECTS is 10. 2. Chronic microvascular ischemic disease. Code stroke imaging results were communicated on 12/31/2023 at 10:21 pm to provider Dr. Michaela via secure text paging. Electronically Signed   By: Gilmore GORMAN Molt M.D.   On: 12/31/2023 22:21    Micro Results    Recent Results (from the past 240 hours)  Blood culture (routine x 2)     Status: Abnormal   Collection Time: 12/31/23 10:25 PM   Specimen: BLOOD  Result Value Ref Range Status   Specimen Description BLOOD RIGHT ANTECUBITAL  Final   Special Requests   Final    BOTTLES DRAWN AEROBIC AND ANAEROBIC Blood Culture  adequate volume   Culture  Setup Time   Final    GRAM NEGATIVE RODS IN BOTH AEROBIC AND ANAEROBIC BOTTLES CRITICAL VALUE NOTED.  VALUE IS CONSISTENT WITH PREVIOUSLY REPORTED AND CALLED VALUE.    Culture (A)  Final    ESCHERICHIA COLI SUSCEPTIBILITIES PERFORMED ON PREVIOUS CULTURE WITHIN THE LAST 5 DAYS. Performed at [facility], [address], [place], [place] [address]    Report Status 01/03/2024 FINAL  Final  Blood culture (routine x 2)     Status: Abnormal  Collection Time: 12/31/23 10:36 PM   Specimen: BLOOD LEFT HAND  Result Value Ref Range Status   Specimen Description BLOOD LEFT HAND  Final   Special Requests   Final    BOTTLES DRAWN AEROBIC AND ANAEROBIC Blood Culture results may not be optimal due to an inadequate volume of blood received in culture bottles   Culture  Setup Time   Final    GRAM NEGATIVE RODS IN BOTH AEROBIC AND ANAEROBIC BOTTLES CRITICAL RESULT CALLED TO, READ BACK BY AND VERIFIED WITH: PHARMD EMILY [id number] AT 1057, ADC Performed at [facility], [address] [place], [place] [address]    Culture ESCHERICHIA COLI (A)  Final   Report Status 01/03/2024 FINAL  Final   Organism ID, Bacteria ESCHERICHIA COLI  Final      Susceptibility   Escherichia coli - KIRBY BAUER*    CEFAZOLIN  SENSITIVE Sensitive     * ESCHERICHIA COLI  Blood Culture ID Panel (Reflexed)     Status: Abnormal   Collection Time: 12/31/23 10:36 PM  Result Value Ref Range Status   Enterococcus faecalis NOT DETECTED NOT DETECTED Final   Enterococcus Faecium NOT DETECTED NOT DETECTED Final   Listeria monocytogenes NOT DETECTED NOT DETECTED Final   Staphylococcus species NOT DETECTED NOT DETECTED Final   Staphylococcus aureus (BCID) NOT DETECTED NOT DETECTED Final   Staphylococcus epidermidis NOT DETECTED NOT DETECTED Final   Staphylococcus lugdunensis NOT DETECTED NOT DETECTED Final   Streptococcus species NOT DETECTED NOT DETECTED Final   Streptococcus agalactiae NOT  DETECTED NOT DETECTED Final   Streptococcus pneumoniae NOT DETECTED NOT DETECTED Final   Streptococcus pyogenes NOT DETECTED NOT DETECTED Final   A.calcoaceticus-baumannii NOT DETECTED NOT DETECTED Final   Bacteroides fragilis NOT DETECTED NOT DETECTED Final   Enterobacterales DETECTED (A) NOT DETECTED Final    Comment: Enterobacterales represent a large order of gram negative bacteria, not a single organism. CRITICAL RESULT CALLED TO, READ BACK BY AND VERIFIED WITH: PHARMD EMILY [id number] AT 1057, ADC    Enterobacter cloacae complex NOT DETECTED NOT DETECTED Final   Escherichia coli DETECTED (A) NOT DETECTED Final    Comment: CRITICAL RESULT CALLED TO, READ BACK BY AND VERIFIED WITH: PHARMD EMILY [id number] AT 1057, ADC    Klebsiella aerogenes NOT DETECTED NOT DETECTED Final   Klebsiella oxytoca NOT DETECTED NOT DETECTED Final   Klebsiella pneumoniae NOT DETECTED NOT DETECTED Final   Proteus species NOT DETECTED NOT DETECTED Final   Salmonella species NOT DETECTED NOT DETECTED Final   Serratia marcescens NOT DETECTED NOT DETECTED Final   Haemophilus influenzae NOT DETECTED NOT DETECTED Final   Neisseria meningitidis NOT DETECTED NOT DETECTED Final   Pseudomonas aeruginosa NOT DETECTED NOT DETECTED Final   Stenotrophomonas maltophilia NOT DETECTED NOT DETECTED Final   Candida albicans NOT DETECTED NOT DETECTED Final   Candida auris NOT DETECTED NOT DETECTED Final   Candida glabrata NOT DETECTED NOT DETECTED Final   Candida krusei NOT DETECTED NOT DETECTED Final   Candida parapsilosis NOT DETECTED NOT DETECTED Final   Candida tropicalis NOT DETECTED NOT DETECTED Final   Cryptococcus neoformans/gattii NOT DETECTED NOT DETECTED Final   CTX-M ESBL NOT DETECTED NOT DETECTED Final   Carbapenem resistance IMP NOT DETECTED NOT DETECTED Final   Carbapenem resistance KPC NOT DETECTED NOT DETECTED Final   Carbapenem resistance NDM NOT DETECTED NOT DETECTED Final   Carbapenem resist OXA 48 LIKE NOT  DETECTED NOT DETECTED Final   Carbapenem resistance VIM NOT DETECTED NOT DETECTED  Final    Comment: Performed at [facility], [address] [place], [place] [address]  Urine Culture     Status: Abnormal   Collection Time: 12/31/23 10:42 PM   Specimen: Urine, Random  Result Value Ref Range Status   Specimen Description URINE, RANDOM  Final   Special Requests   Final    NONE Reflexed from [id number] Performed at [facility], [address] [place], [place] [address]    Culture 20,000 COLONIES/mL ESCHERICHIA COLI (A)  Final   Report Status 01/02/2024 FINAL  Final   Organism ID, Bacteria ESCHERICHIA COLI (A)  Final      Susceptibility   Escherichia coli - MIC*    AMPICILLIN <=2 SENSITIVE Sensitive     CEFAZOLIN  <=4 SENSITIVE Sensitive     CEFEPIME  <=0.12 SENSITIVE Sensitive     CEFTRIAXONE  <=0.25 SENSITIVE Sensitive     CIPROFLOXACIN  <=0.25 SENSITIVE Sensitive     GENTAMICIN <=1 SENSITIVE Sensitive     IMIPENEM <=0.25 SENSITIVE Sensitive     NITROFURANTOIN <=16 SENSITIVE Sensitive     TRIMETH/SULFA <=20 SENSITIVE Sensitive     AMPICILLIN/SULBACTAM <=2 SENSITIVE Sensitive     PIP/TAZO <=4 SENSITIVE Sensitive ug/mL    * 20,000 COLONIES/mL ESCHERICHIA COLI  Resp panel by RT-PCR (RSV, Flu A&B, Covid) Urine, Catheterized     Status: None   Collection Time: 12/31/23 10:47 PM   Specimen: Urine, Catheterized; Nasal Swab  Result Value Ref Range Status   SARS Coronavirus 2 by RT PCR NEGATIVE NEGATIVE Final   Influenza A by PCR NEGATIVE NEGATIVE Final   Influenza B by PCR NEGATIVE NEGATIVE Final    Comment: (NOTE) The Xpert Xpress SARS-CoV-2/FLU/RSV plus assay is intended as an aid in the diagnosis of influenza from Nasopharyngeal swab specimens and should not be used as a sole basis for treatment. Nasal washings and aspirates are unacceptable for Xpert Xpress SARS-CoV-2/FLU/RSV testing.  Fact Sheet for Patients: BloggerCourse.com  Fact Sheet for  Healthcare Providers: SeriousBroker.it  This test is not yet approved or cleared by the [place]  FDA and has been authorized for detection and/or diagnosis of SARS-CoV-2 by FDA under an Emergency Use Authorization (EUA). This EUA will remain in effect (meaning this test can be used) for the duration of the COVID-19 declaration under Section 564(b)(1) of the Act, 21 U.S.C. section 360bbb-3(b)(1), unless the authorization is terminated or revoked.     Resp Syncytial Virus by PCR NEGATIVE NEGATIVE Final    Comment: (NOTE) Fact Sheet for Patients: BloggerCourse.com  Fact Sheet for Healthcare Providers: SeriousBroker.it  This test is not yet approved or cleared by the [place]  FDA and has been authorized for detection and/or diagnosis of SARS-CoV-2 by FDA under an Emergency Use Authorization (EUA). This EUA will remain in effect (meaning this test can be used) for the duration of the COVID-19 declaration under Section 564(b)(1) of the Act, 21 U.S.C. section 360bbb-3(b)(1), unless the authorization is terminated or revoked.  Performed at [facility], [address] [place], [place] [address]   MRSA Next Gen by PCR, Nasal     Status: None   Collection Time: 01/01/24 12:53 AM   Specimen: Nasal Mucosa; Nasal Swab  Result Value Ref Range Status   MRSA by PCR Next Gen NOT DETECTED NOT DETECTED Final    Comment: (NOTE) The GeneXpert MRSA Assay (FDA approved for NASAL specimens only), is one component of a comprehensive MRSA colonization surveillance program. It is not intended to diagnose  MRSA infection nor to guide or monitor treatment for MRSA infections. Test performance is not FDA approved in patients less than 81 years old. Performed at [facility], [address], [place], [place] [address]     Today   Subjective    Ryan  Lerch today has no headache,no chest abdominal  pain,no new weakness tingling or numbness, feels much better wants to go home today.    Objective   Blood pressure 110/62, pulse 76, temperature 98.4 F (36.9 C), temperature source Oral, resp. rate 20, height 5' 5 (1.651 m), weight 81 kg, SpO2 91%.   Intake/Output Summary (Last 24 hours) at 01/03/2024 1128 Last data filed at 01/03/2024 0540 Gross per 24 hour  Intake --  Output 975 ml  Net -975 ml    Exam  Awake Alert, No new F.N deficits,    [place].AT,PERRAL Supple Neck,   Symmetrical Chest wall movement, Good air movement bilaterally, CTAB RRR,No Gallops,   +ve B.Sounds, Abd Soft, Non tender,  No Cyanosis, Clubbing or edema    Data Review   Recent Labs  Lab 12/31/23 2210 12/31/23 2213 01/01/24 0414 01/02/24 0631 01/03/24 0616  WBC  --  9.3 18.8* 12.9* 12.8*  HGB 15.6* 15.1* 14.3 13.4 13.9  HCT 46.0 44.3 42.3 39.1 41.1  PLT  --  157 163 163 194  MCV  --  93.7 94.6 92.2 93.0  MCH  --  31.9 32.0 31.6 31.4  MCHC  --  34.1 33.8 34.3 33.8  RDW  --  12.3 12.5 12.5 12.3  LYMPHSABS  --  0.6*  --  0.9 0.9  MONOABS  --  0.2  --  1.1* 1.2*  EOSABS  --  0.0  --  0.0 0.2  BASOSABS  --  0.0  --  0.0 0.0    Recent Labs  Lab 12/31/23 2210 12/31/23 2213 12/31/23 2223 01/01/24 0007 01/01/24 0414 01/02/24 0631 01/03/24 0616  NA 134* 134*  --   --  135 136 137  K 3.8 3.8  --   --  3.2* 3.9 3.6  CL 100 99  --   --  102 100 102  CO2  --  23  --   --  22 25 25   ANIONGAP  --  12  --   --  11 11 10   GLUCOSE 192* 185*  --   --  175* 145* 126*  BUN 17 15  --   --  16 12 10   CREATININE 1.00 1.09*  --   --  1.28* 1.01* 0.80  AST  --  31  --   --  24  --   --   ALT  --  16  --   --  14  --   --   ALKPHOS  --  103  --   --  79  --   --   BILITOT  --  1.5*  --   --  1.2  --   --   ALBUMIN   --  3.2*  --   --  2.8*  --   --   CRP  --   --   --   --   --  33.6* 32.5*  PROCALCITON  --   --   --   --   --  22.00 11.57  LATICACIDVEN  --   --  2.3* 2.0*  --   --   --   INR  --  1.1  --    --   --   --   --  TSH  --  1.481  --   --   --   --   --   AMMONIA  --   --   --   --   --  25  --   BNP  --   --   --   --   --  744.1*  --   MG  --   --   --   --   --  1.8 2.0  CALCIUM   --  10.2  --   --  9.8 9.5 9.8    Total Time in preparing paper work, data evaluation and todays exam - 35 minutes  Signature  -    Lavada Stank M.D on 01/03/2024 at 11:28 AM   -  To page go to [email or web address]

## 2024-01-03 NOTE — Discharge Instructions (Addendum)
 Follow with Primary MD Marvene Prentice SAUNDERS, FNP in 7 days, review your CT scans with your PCP in detail  Get CBC, CMP, Magnesium  -  checked next visit with your primary MD   Activity: As tolerated with Full fall precautions use walker/cane & assistance as needed  Disposition Home    Diet: Heart Healthy   Special Instructions: If you have smoked or chewed Tobacco  in the last 2 yrs please stop smoking, stop any regular Alcohol  and or any Recreational drug use.  On your next visit with your primary care physician please Get Medicines reviewed and adjusted.  Please request your Prim.MD to go over all Hospital Tests and Procedure/Radiological results at the follow up, please get all Hospital records sent to your Prim MD by signing hospital release before you go home.  If you experience worsening of your admission symptoms, develop shortness of breath, life threatening emergency, suicidal or homicidal thoughts you must seek medical attention immediately by calling 911 or calling your MD immediately  if symptoms less severe.  You Must read complete instructions/literature along with all the possible adverse reactions/side effects for all the Medicines you take and that have been prescribed to you. Take any new Medicines after you have completely understood and accpet all the possible adverse reactions/side effects.   Do not drive when taking Pain medications.  Do not take more than prescribed Pain, Sleep and Anxiety Medications  Wear Seat belts while driving.

## 2024-01-03 NOTE — Plan of Care (Signed)

## 2024-01-04 LAB — LEGIONELLA PNEUMOPHILA SEROGP 1 UR AG: L. pneumophila Serogp 1 Ur Ag: NEGATIVE

## 2024-01-05 ENCOUNTER — Other Ambulatory Visit (HOSPITAL_COMMUNITY): Payer: Self-pay

## 2024-01-08 DIAGNOSIS — M5416 Radiculopathy, lumbar region: Secondary | ICD-10-CM | POA: Diagnosis not present

## 2024-01-15 DIAGNOSIS — M1990 Unspecified osteoarthritis, unspecified site: Secondary | ICD-10-CM | POA: Diagnosis not present

## 2024-01-15 DIAGNOSIS — K219 Gastro-esophageal reflux disease without esophagitis: Secondary | ICD-10-CM | POA: Diagnosis not present

## 2024-01-15 DIAGNOSIS — E782 Mixed hyperlipidemia: Secondary | ICD-10-CM | POA: Diagnosis not present

## 2024-01-15 DIAGNOSIS — I272 Pulmonary hypertension, unspecified: Secondary | ICD-10-CM | POA: Diagnosis not present

## 2024-01-15 DIAGNOSIS — R5383 Other fatigue: Secondary | ICD-10-CM | POA: Diagnosis not present

## 2024-01-15 DIAGNOSIS — Z09 Encounter for follow-up examination after completed treatment for conditions other than malignant neoplasm: Secondary | ICD-10-CM | POA: Diagnosis not present

## 2024-01-15 DIAGNOSIS — E559 Vitamin D deficiency, unspecified: Secondary | ICD-10-CM | POA: Diagnosis not present

## 2024-01-15 DIAGNOSIS — I700 Atherosclerosis of aorta: Secondary | ICD-10-CM | POA: Diagnosis not present

## 2024-01-15 DIAGNOSIS — R7881 Bacteremia: Secondary | ICD-10-CM | POA: Diagnosis not present

## 2024-01-20 DIAGNOSIS — G9341 Metabolic encephalopathy: Secondary | ICD-10-CM | POA: Diagnosis not present

## 2024-01-20 DIAGNOSIS — M170 Bilateral primary osteoarthritis of knee: Secondary | ICD-10-CM | POA: Diagnosis not present

## 2024-01-20 DIAGNOSIS — I21A1 Myocardial infarction type 2: Secondary | ICD-10-CM | POA: Diagnosis not present

## 2024-01-20 DIAGNOSIS — E782 Mixed hyperlipidemia: Secondary | ICD-10-CM | POA: Diagnosis not present

## 2024-01-20 DIAGNOSIS — K219 Gastro-esophageal reflux disease without esophagitis: Secondary | ICD-10-CM | POA: Diagnosis not present

## 2024-01-20 DIAGNOSIS — I272 Pulmonary hypertension, unspecified: Secondary | ICD-10-CM | POA: Diagnosis not present

## 2024-01-20 DIAGNOSIS — I700 Atherosclerosis of aorta: Secondary | ICD-10-CM | POA: Diagnosis not present

## 2024-01-20 DIAGNOSIS — A4151 Sepsis due to Escherichia coli [E. coli]: Secondary | ICD-10-CM | POA: Diagnosis not present

## 2024-01-20 DIAGNOSIS — F3342 Major depressive disorder, recurrent, in full remission: Secondary | ICD-10-CM | POA: Diagnosis not present

## 2024-01-23 DIAGNOSIS — H43813 Vitreous degeneration, bilateral: Secondary | ICD-10-CM | POA: Diagnosis not present

## 2024-01-23 DIAGNOSIS — H5213 Myopia, bilateral: Secondary | ICD-10-CM | POA: Diagnosis not present

## 2024-01-27 DIAGNOSIS — H6123 Impacted cerumen, bilateral: Secondary | ICD-10-CM | POA: Diagnosis not present

## 2024-01-27 DIAGNOSIS — R42 Dizziness and giddiness: Secondary | ICD-10-CM | POA: Diagnosis not present

## 2024-01-28 DIAGNOSIS — H8113 Benign paroxysmal vertigo, bilateral: Secondary | ICD-10-CM | POA: Diagnosis not present

## 2024-01-28 DIAGNOSIS — R350 Frequency of micturition: Secondary | ICD-10-CM | POA: Diagnosis not present

## 2024-01-29 DIAGNOSIS — N390 Urinary tract infection, site not specified: Secondary | ICD-10-CM | POA: Diagnosis not present

## 2024-02-03 DIAGNOSIS — M1711 Unilateral primary osteoarthritis, right knee: Secondary | ICD-10-CM | POA: Diagnosis not present

## 2024-02-24 DIAGNOSIS — R42 Dizziness and giddiness: Secondary | ICD-10-CM | POA: Diagnosis not present

## 2024-03-01 ENCOUNTER — Other Ambulatory Visit: Payer: Self-pay | Admitting: Physician Assistant

## 2024-03-05 DIAGNOSIS — M1711 Unilateral primary osteoarthritis, right knee: Secondary | ICD-10-CM | POA: Diagnosis not present

## 2024-03-05 DIAGNOSIS — M25561 Pain in right knee: Secondary | ICD-10-CM | POA: Diagnosis not present

## 2024-03-05 DIAGNOSIS — S83411A Sprain of medial collateral ligament of right knee, initial encounter: Secondary | ICD-10-CM | POA: Diagnosis not present

## 2024-03-09 DIAGNOSIS — Z23 Encounter for immunization: Secondary | ICD-10-CM | POA: Diagnosis not present

## 2024-03-27 DIAGNOSIS — I272 Pulmonary hypertension, unspecified: Secondary | ICD-10-CM | POA: Diagnosis not present

## 2024-03-27 DIAGNOSIS — I130 Hypertensive heart and chronic kidney disease with heart failure and stage 1 through stage 4 chronic kidney disease, or unspecified chronic kidney disease: Secondary | ICD-10-CM | POA: Diagnosis not present

## 2024-03-27 DIAGNOSIS — I509 Heart failure, unspecified: Secondary | ICD-10-CM | POA: Diagnosis not present

## 2024-03-27 DIAGNOSIS — I422 Other hypertrophic cardiomyopathy: Secondary | ICD-10-CM | POA: Diagnosis not present

## 2024-03-27 DIAGNOSIS — F325 Major depressive disorder, single episode, in full remission: Secondary | ICD-10-CM | POA: Diagnosis not present

## 2024-03-27 DIAGNOSIS — M519 Unspecified thoracic, thoracolumbar and lumbosacral intervertebral disc disorder: Secondary | ICD-10-CM | POA: Diagnosis not present

## 2024-03-27 DIAGNOSIS — I700 Atherosclerosis of aorta: Secondary | ICD-10-CM | POA: Diagnosis not present

## 2024-03-27 DIAGNOSIS — Z7982 Long term (current) use of aspirin: Secondary | ICD-10-CM | POA: Diagnosis not present

## 2024-03-27 DIAGNOSIS — G9340 Encephalopathy, unspecified: Secondary | ICD-10-CM | POA: Diagnosis not present

## 2024-03-27 DIAGNOSIS — R569 Unspecified convulsions: Secondary | ICD-10-CM | POA: Diagnosis not present

## 2024-03-27 DIAGNOSIS — F419 Anxiety disorder, unspecified: Secondary | ICD-10-CM | POA: Diagnosis not present

## 2024-03-27 DIAGNOSIS — E213 Hyperparathyroidism, unspecified: Secondary | ICD-10-CM | POA: Diagnosis not present

## 2024-03-27 DIAGNOSIS — N959 Unspecified menopausal and perimenopausal disorder: Secondary | ICD-10-CM | POA: Diagnosis not present

## 2024-03-27 DIAGNOSIS — I25119 Atherosclerotic heart disease of native coronary artery with unspecified angina pectoris: Secondary | ICD-10-CM | POA: Diagnosis not present

## 2024-03-27 DIAGNOSIS — M545 Low back pain, unspecified: Secondary | ICD-10-CM | POA: Diagnosis not present

## 2024-03-27 DIAGNOSIS — R7303 Prediabetes: Secondary | ICD-10-CM | POA: Diagnosis not present

## 2024-03-27 DIAGNOSIS — M810 Age-related osteoporosis without current pathological fracture: Secondary | ICD-10-CM | POA: Diagnosis not present

## 2024-03-27 DIAGNOSIS — N1831 Chronic kidney disease, stage 3a: Secondary | ICD-10-CM | POA: Diagnosis not present

## 2024-03-27 DIAGNOSIS — H81399 Other peripheral vertigo, unspecified ear: Secondary | ICD-10-CM | POA: Diagnosis not present

## 2024-03-27 DIAGNOSIS — F1120 Opioid dependence, uncomplicated: Secondary | ICD-10-CM | POA: Diagnosis not present

## 2024-03-30 DIAGNOSIS — H9121 Sudden idiopathic hearing loss, right ear: Secondary | ICD-10-CM | POA: Diagnosis not present

## 2024-04-01 DIAGNOSIS — H90A31 Mixed conductive and sensorineural hearing loss, unilateral, right ear with restricted hearing on the contralateral side: Secondary | ICD-10-CM | POA: Diagnosis not present

## 2024-04-01 DIAGNOSIS — H6993 Unspecified Eustachian tube disorder, bilateral: Secondary | ICD-10-CM | POA: Diagnosis not present

## 2024-04-01 DIAGNOSIS — H6504 Acute serous otitis media, recurrent, right ear: Secondary | ICD-10-CM | POA: Diagnosis not present

## 2024-04-09 ENCOUNTER — Other Ambulatory Visit: Payer: Self-pay | Admitting: Cardiovascular Disease

## 2024-04-09 DIAGNOSIS — Z6830 Body mass index (BMI) 30.0-30.9, adult: Secondary | ICD-10-CM | POA: Diagnosis not present

## 2024-04-09 DIAGNOSIS — N390 Urinary tract infection, site not specified: Secondary | ICD-10-CM | POA: Diagnosis not present

## 2024-04-15 DIAGNOSIS — M1711 Unilateral primary osteoarthritis, right knee: Secondary | ICD-10-CM | POA: Diagnosis not present

## 2024-04-15 DIAGNOSIS — S8001XD Contusion of right knee, subsequent encounter: Secondary | ICD-10-CM | POA: Diagnosis not present

## 2024-04-15 DIAGNOSIS — M25561 Pain in right knee: Secondary | ICD-10-CM | POA: Diagnosis not present

## 2024-04-22 DIAGNOSIS — Z760 Encounter for issue of repeat prescription: Secondary | ICD-10-CM | POA: Diagnosis not present

## 2024-04-22 DIAGNOSIS — R350 Frequency of micturition: Secondary | ICD-10-CM | POA: Diagnosis not present

## 2024-05-10 DIAGNOSIS — E213 Hyperparathyroidism, unspecified: Secondary | ICD-10-CM | POA: Diagnosis not present

## 2024-05-10 DIAGNOSIS — I272 Pulmonary hypertension, unspecified: Secondary | ICD-10-CM | POA: Diagnosis not present

## 2024-05-10 DIAGNOSIS — M810 Age-related osteoporosis without current pathological fracture: Secondary | ICD-10-CM | POA: Diagnosis not present

## 2024-05-10 DIAGNOSIS — R7303 Prediabetes: Secondary | ICD-10-CM | POA: Diagnosis not present

## 2024-05-10 DIAGNOSIS — E782 Mixed hyperlipidemia: Secondary | ICD-10-CM | POA: Diagnosis not present

## 2024-05-10 DIAGNOSIS — K219 Gastro-esophageal reflux disease without esophagitis: Secondary | ICD-10-CM | POA: Diagnosis not present

## 2024-05-10 DIAGNOSIS — I700 Atherosclerosis of aorta: Secondary | ICD-10-CM | POA: Diagnosis not present

## 2024-05-10 DIAGNOSIS — N390 Urinary tract infection, site not specified: Secondary | ICD-10-CM | POA: Diagnosis not present

## 2024-05-10 DIAGNOSIS — E559 Vitamin D deficiency, unspecified: Secondary | ICD-10-CM | POA: Diagnosis not present

## 2024-05-11 ENCOUNTER — Other Ambulatory Visit: Payer: Self-pay | Admitting: Physician Assistant

## 2024-05-25 ENCOUNTER — Other Ambulatory Visit: Payer: Self-pay | Admitting: Cardiovascular Disease

## 2024-05-25 NOTE — Telephone Encounter (Signed)
 Patient most schedule an overdue appointment for further refills.

## 2024-05-31 ENCOUNTER — Other Ambulatory Visit: Payer: Self-pay | Admitting: Cardiovascular Disease

## 2024-06-25 ENCOUNTER — Encounter: Payer: Self-pay | Admitting: Cardiovascular Disease

## 2024-06-28 ENCOUNTER — Telehealth (HOSPITAL_BASED_OUTPATIENT_CLINIC_OR_DEPARTMENT_OTHER): Payer: Self-pay | Admitting: *Deleted

## 2024-06-28 NOTE — Telephone Encounter (Signed)
 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   They will need to fax over clearance to us  for request. [phone].   Thank you,   Burnard   I will send over to pre-op  team to be on the look out.  This MyChart message has not been read. Micha  Jenkins Lyle Jenkins to P Cv Div [facility] Triage (supporting Dorn JINNY Lesches, MD)     06/25/24  4:32 PM The hospital said to notify  you that I am having this surgery .Can you send  a clearance statement to [facility]  attent. Doctor Dempsey Aluisio ? Thank you for your time.  Kaitlyne  Lynda Wanninger  Friday , Jan.23 2026+

## 2024-07-01 NOTE — Patient Instructions (Addendum)
 SURGICAL WAITING ROOM VISITATION Patients having surgery or a procedure may have no more than 2 support people in the waiting area - these visitors may rotate.    Children under the age of 53 will not be allowed to visit due to the increase in respiratory illness  Children under the age of 14 must have an adult with them who is not the patient.  If the patient needs to stay at the hospital during part of their recovery, the visitor guidelines for inpatient rooms apply. Pre-op  nurse will coordinate an appropriate time for 1 support person to accompany patient in pre-op .  This support person may not rotate.    Please refer to the [facility] website for the visitor guidelines for Inpatients (after your surgery is over and you are in a regular room).       Your procedure is scheduled on: 07-12-24   Report to [facility] Main Entrance    Report to admitting at 10:45 AM   Call this number if you have problems the morning of surgery [phone]   Do not eat food :After Midnight.   After Midnight you may have the following liquids until 10:15 AM DAY OF SURGERY  Water  Non-Citrus Juices (without pulp, NO RED-Apple, White grape, White cranberry) Black Coffee (NO MILK/CREAM OR CREAMERS, sugar ok)  Clear Tea (NO MILK/CREAM OR CREAMERS, sugar ok) regular and decaf                             Plain Jell-O (NO RED)                                           Fruit ices (not with fruit pulp, NO RED)                                     Popsicles (NO RED)                                                               Sports drinks like Gatorade (NO RED)                   The day of surgery:  Drink ONE (1) Pre-Surgery Clear Ensure by 10:15 AM the morning of surgery. Drink in one sitting. Do not sip.  This drink was given to you during your hospital  pre-op  appointment visit. Nothing else to drink after completing the Pre-Surgery Clear Ensure          If you have questions, please contact  your surgeons office.   FOLLOW ANY ADDITIONAL PRE OP INSTRUCTIONS YOU RECEIVED FROM YOUR SURGEON'S OFFICE!!!     Oral Hygiene is also important to reduce your risk of infection.                                    Remember - BRUSH YOUR TEETH THE MORNING OF SURGERY WITH YOUR REGULAR TOOTHPASTE   Do NOT smoke after Midnight  Take these medicines the morning of surgery with A SIP OF WATER :    Esomeprazole (Nexium)   Fluoxetine  (Prozac )   Meclizine  (Antivert )   Metoprolol  (Lopressor )   Rosuvastatin  (Crestor )   Okay to use Flonase nasal spray   If needed Ondansetron  (Zofran ), Tramadol  Stop all vitamins and herbal supplements 7 days before surgery                              You may not have any metal on your body including hair pins, jewelry, and body piercing             Do not wear make-up, lotions, powders, perfumes or deodorant  Do not wear nail polish including gel and S&S, artificial/acrylic nails, or any other type of covering on natural nails including finger and toenails. If you have artificial nails, gel coating, etc. that needs to be removed by a nail salon please have this removed prior to surgery or surgery may need to be canceled/ delayed if the surgeon/ anesthesia feels like they are unable to be safely monitored.   Do not shave  48 hours prior to surgery.    Do not bring valuables to the hospital. [facility] IS NOT RESPONSIBLE   FOR VALUABLES.   Contacts, dentures or bridgework may not be worn into surgery.   Bring small overnight bag day of surgery.   DO NOT BRING YOUR HOME MEDICATIONS TO THE HOSPITAL. PHARMACY WILL DISPENSE MEDICATIONS LISTED ON YOUR MEDICATION LIST TO YOU DURING YOUR ADMISSION IN THE HOSPITAL!   Special Instructions: Bring a copy of your healthcare power of attorney and living will documents the day of surgery if you haven't scanned them before.              Please read over the following fact sheets you were given: IF YOU HAVE QUESTIONS  ABOUT YOUR PRE-OP  INSTRUCTIONS PLEASE CALL [phone] [name]  If you received a COVID test during your pre-op  visit  it is requested that you wear a mask when out in public, stay away from anyone that may not be feeling well and notify your surgeon if you develop symptoms. If you test positive for Covid or have been in contact with anyone that has tested positive in the last 10 days please notify you surgeon.   Pre-operative 4 CHG Bath Instructions  DYNA-Hex 4 Chlorhexidine  Gluconate 4% Solution Antiseptic 4 fl. oz   You can play a key role in reducing the risk of infection after surgery. Your skin needs to be as free of germs as possible. You can reduce the number of germs on your skin by washing with CHG (chlorhexidine  gluconate) soap before surgery. CHG is an antiseptic soap that kills germs and continues to kill germs even after washing.   DO NOT use if you have an allergy to chlorhexidine /CHG or antibacterial soaps. If your skin becomes reddened or irritated, stop using the CHG and notify one of our RNs at   Please shower with the CHG soap starting 4 days before surgery using the following schedule:     Please keep in mind the following:  DO NOT shave, including legs and underarms, starting the day of your first shower.   You may shave your face at any point before/day of surgery.  Place clean sheets on your bed the day you start using CHG soap. Use a clean washcloth (not used since being washed) for each shower. DO  NOT sleep with pets once you start using the CHG.  CHG Shower Instructions:  If you choose to wash your hair and private area, wash first with your normal shampoo/soap.  After you use shampoo/soap, rinse your hair and body thoroughly to remove shampoo/soap residue.  Turn the water  OFF and apply about 3 tablespoons (45 ml) of CHG soap to a CLEAN washcloth.  Apply CHG soap ONLY FROM YOUR NECK DOWN TO YOUR TOES (washing for 3-5 minutes)  DO NOT use CHG soap on face, private  areas, open wounds, or sores.  Pay special attention to the area where your surgery is being performed.  If you are having back surgery, having someone wash your back for you may be helpful. Wait 2 minutes after CHG soap is applied, then you may rinse off the CHG soap.  Pat dry with a clean towel  Put on clean clothes/pajamas   If you choose to wear lotion, please use ONLY the CHG-compatible lotions on the back of this paper.     Additional instructions for the day of surgery:  Shower with regular soap the day of surgery DO NOT APPLY any lotions, deodorants, cologne, or perfumes.   Put on clean/comfortable clothes.  Brush your teeth.  Ask your nurse before applying any prescription medications to the skin.   CHG Compatible Lotions   Aveeno Moisturizing lotion  Cetaphil Moisturizing Cream  Cetaphil Moisturizing Lotion  Clairol Herbal Essence Moisturizing Lotion, Dry Skin  Clairol Herbal Essence Moisturizing Lotion, Extra Dry Skin  Clairol Herbal Essence Moisturizing Lotion, Normal Skin  Curel Age Defying Therapeutic Moisturizing Lotion with Alpha Hydroxy  Curel Extreme Care Body Lotion  Curel Soothing Hands Moisturizing Hand Lotion  Curel Therapeutic Moisturizing Cream, Fragrance-Free  Curel Therapeutic Moisturizing Lotion, Fragrance-Free  Curel Therapeutic Moisturizing Lotion, Original Formula  Eucerin Daily Replenishing Lotion  Eucerin Dry Skin Therapy Plus Alpha Hydroxy Crme  Eucerin Dry Skin Therapy Plus Alpha Hydroxy Lotion  Eucerin Original Crme  Eucerin Original Lotion  Eucerin Plus Crme Eucerin Plus Lotion  Eucerin TriLipid Replenishing Lotion  Keri Anti-Bacterial Hand Lotion  Keri Deep Conditioning Original Lotion Dry Skin Formula Softly Scented  Keri Deep Conditioning Original Lotion, Fragrance Free Sensitive Skin Formula  Keri Lotion Fast Absorbing Fragrance Free Sensitive Skin Formula  Keri Lotion Fast Absorbing Softly Scented Dry Skin Formula  Keri Original  Lotion  Keri Skin Renewal Lotion Keri Silky Smooth Lotion  Keri Silky Smooth Sensitive Skin Lotion  Nivea Body Creamy Conditioning Oil  Nivea Body Extra Enriched Lotion  Nivea Body Original Lotion  Nivea Body Sheer Moisturizing Lotion Nivea Crme  Nivea Skin Firming Lotion  NutraDerm 30 Skin Lotion  NutraDerm Skin Lotion  NutraDerm Therapeutic Skin Cream  NutraDerm Therapeutic Skin Lotion  ProShield Protective Hand Cream  Provon moisturizing lotion   PATIENT SIGNATURE_________________________________  NURSE SIGNATURE__________________________________  ________________________________________________________________________    Christina Melendez  An incentive spirometer is a tool that can help keep your lungs clear and active. This tool measures how well you are filling your lungs with each breath. Taking long deep breaths may help reverse or decrease the chance of developing breathing (pulmonary) problems (especially infection) following: A long period of time when you are unable to move or be active. BEFORE THE PROCEDURE  If the spirometer includes an indicator to show your best effort, your nurse or respiratory therapist will set it to a desired goal. If possible, sit up straight or lean slightly forward. Try not to slouch. Hold the incentive spirometer in  an upright position. INSTRUCTIONS FOR USE  Sit on the edge of your bed if possible, or sit up as far as you can in bed or on a chair. Hold the incentive spirometer in an upright position. Breathe out normally. Place the mouthpiece in your mouth and seal your lips tightly around it. Breathe in slowly and as deeply as possible, raising the piston or the ball toward the top of the column. Hold your breath for 3-5 seconds or for as long as possible. Allow the piston or ball to fall to the bottom of the column. Remove the mouthpiece from your mouth and breathe out normally. Rest for a few seconds and repeat Steps 1 through 7  at least 10 times every 1-2 hours when you are awake. Take your time and take a few normal breaths between deep breaths. The spirometer may include an indicator to show your best effort. Use the indicator as a goal to work toward during each repetition. After each set of 10 deep breaths, practice coughing to be sure your lungs are clear. If you have an incision (the cut made at the time of surgery), support your incision when coughing by placing a pillow or rolled up towels firmly against it. Once you are able to get out of bed, walk around indoors and cough well. You may stop using the incentive spirometer when instructed by your caregiver.  RISKS AND COMPLICATIONS Take your time so you do not get dizzy or light-headed. If you are in pain, you may need to take or ask for pain medication before doing incentive spirometry. It is harder to take a deep breath if you are having pain. AFTER USE Rest and breathe slowly and easily. It can be helpful to keep track of a log of your progress. Your caregiver can provide you with a simple table to help with this. If you are using the spirometer at home, follow these instructions: SEEK MEDICAL CARE IF:  You are having difficultly using the spirometer. You have trouble using the spirometer as often as instructed. Your pain medication is not giving enough relief while using the spirometer. You develop fever of 100.5 F (38.1 C) or higher. SEEK IMMEDIATE MEDICAL CARE IF:  You cough up bloody sputum that had not been present before. You develop fever of 102 F (38.9 C) or greater. You develop worsening pain at or near the incision site. MAKE SURE YOU:  Understand these instructions. Will watch your condition. Will get help right away if you are not doing well or get worse. Document Released: 09/30/2006 Document Revised: 08/12/2011 Document Reviewed: 12/01/2006 [facility] Patient Information 2014 [place], [place].

## 2024-07-01 NOTE — Progress Notes (Addendum)
 Date of COVID positive in last 90 days:  No  PCP - Prentice Batch, FNP Cardiologist - Dorn Lesches, MD Pulmonologist - Dorethia Cave, MD  Chest x-ray - 01-02-24 Epic EKG - 01-02-24 Epic Stress Test - 02-28-23 Epic ECHO - 02-28-23 Epic Cardiac Cath - N/A Long Term Monitor - 2022 Epic Coronary CT - 12-30-19 Epic Pacemaker/ICD device last checked:N/A Spinal Cord Stimulator:N/A  Bowel Prep - N/A  Sleep Study - N/A CPAP -   Fasting Blood Sugar - N/A Checks Blood Sugar _____ times a day  Last dose of GLP1 agonist-  N/A GLP1 instructions:  Do not take after     Last dose of SGLT-2 inhibitors-  N/A SGLT-2 instructions:  Do not take after    Blood Thinner Instructions: N/A Last dose:   Time: Aspirin  Instructions:  ASA 81 (per patient to hold x7 days) Last Dose:  Activity level:  Can go up a short flight  of stairs and perform activities of daily living without stopping and without symptoms of chest pain or shortness of breath.  Patient has mobility issues due to severe knee pain, uses a walker.    Anesthesia review: CHF, CAD, HOCM,  murmur  Patient denies shortness of breath, fever, cough and chest pain at PAT appointment  Patient verbalized understanding of instructions that were given to them at the PAT appointment. Patient was also instructed that they will need to review over the PAT instructions again at home before surgery.
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
## 2024-07-02 ENCOUNTER — Encounter (HOSPITAL_COMMUNITY): Payer: Self-pay

## 2024-07-02 ENCOUNTER — Encounter (HOSPITAL_COMMUNITY)
Admission: RE | Admit: 2024-07-02 | Discharge: 2024-07-02 | Disposition: A | Source: Ambulatory Visit | Attending: Orthopedic Surgery

## 2024-07-02 ENCOUNTER — Other Ambulatory Visit: Payer: Self-pay

## 2024-07-02 VITALS — BP 136/74 | HR 67 | Temp 97.5°F | Resp 12 | Ht 65.0 in | Wt 175.0 lb

## 2024-07-02 DIAGNOSIS — Z01812 Encounter for preprocedural laboratory examination: Secondary | ICD-10-CM | POA: Insufficient documentation

## 2024-07-02 DIAGNOSIS — I2510 Atherosclerotic heart disease of native coronary artery without angina pectoris: Secondary | ICD-10-CM | POA: Diagnosis not present

## 2024-07-02 DIAGNOSIS — Z01818 Encounter for other preprocedural examination: Secondary | ICD-10-CM

## 2024-07-02 HISTORY — DX: Atherosclerotic heart disease of native coronary artery without angina pectoris: I25.10

## 2024-07-02 LAB — BASIC METABOLIC PANEL WITH GFR
Anion gap: 10 (ref 5–15)
BUN: 16 mg/dL (ref 8–23)
CO2: 22 mmol/L (ref 22–32)
Calcium: 10.1 mg/dL (ref 8.9–10.3)
Chloride: 104 mmol/L (ref 98–111)
Creatinine, Ser: 0.76 mg/dL (ref 0.44–1.00)
GFR, Estimated: >60 mL/min
Glucose, Bld: 110 mg/dL — ABNORMAL HIGH (ref 70–99)
Potassium: 4.2 mmol/L (ref 3.5–5.1)
Sodium: 136 mmol/L (ref 135–145)

## 2024-07-02 LAB — CBC
HCT: 42.7 % (ref 36.0–46.0)
Hemoglobin: 13.8 g/dL (ref 12.0–15.0)
MCH: 30.3 pg (ref 26.0–34.0)
MCHC: 32.3 g/dL (ref 30.0–36.0)
MCV: 93.8 fL (ref 80.0–100.0)
Platelets: 246 10*3/uL (ref 150–400)
RBC: 4.55 MIL/uL (ref 3.87–5.11)
RDW: 13.1 % (ref 11.5–15.5)
WBC: 6.8 10*3/uL (ref 4.0–10.5)
nRBC: 0 % (ref 0.0–0.2)

## 2024-07-02 LAB — SURGICAL PCR SCREEN
MRSA, PCR: NEGATIVE
Staphylococcus aureus: NEGATIVE

## 2024-07-02 NOTE — Telephone Encounter (Signed)
" ° ° °  Primary Cardiologist:Jonathan Court, MD  Chart reviewed as part of pre-operative protocol coverage. Because of Christina  Ann Melendez's past medical history and time since last visit, he/she will require a follow-up visit in order to better assess preoperative cardiovascular risk.  Pre-op  covering staff: - Please schedule office appointment and call patient to inform them. - Please contact requesting surgeon's office via preferred method (i.e, phone, fax) to inform them of need for appointment prior to surgery.  If applicable, this message will also be routed to pharmacy pool and/or primary cardiologist for input on holding anticoagulant/antiplatelet agent as requested below so that this information is available at time of patient's appointment.   Christina CHRISTELLA Beauvais, NP  07/02/2024, 3:18 PM   "

## 2024-07-02 NOTE — Telephone Encounter (Signed)
 Left message for the surgery scheduler Kirke HERO that we have not yet received the prop clearance request for the pt. I also left on vm that the pt has nor been seen since 05/2023 and I sure preop APP will say that she will need an appt in office.   I will fax this notes as FYI to [facility].

## 2024-07-02 NOTE — Telephone Encounter (Signed)
 Left message to call back and schedule in office appt for preop clearance. Pt last seen 05/21/23 Dr. Court. At this moment there are no opening in time for her surgery 07/12/24. Pt may need to be seen in other locations in order to not delay her surgery.

## 2024-07-02 NOTE — Telephone Encounter (Signed)
"  ° °  Pre-operative Risk Assessment    Patient Name: Christina  Rayonna Melendez  DOB: 18-Sep-1945 MRN: [id number]   Date of last office visit: 05/21/2023 with Dr. Court Date of next office visit: None  Request for Surgical Clearance    Procedure:  Right total knee arthroplasty  Date of Surgery:  Clearance 07/12/24                               Surgeon:  Dr. Dempsey Lawn Surgeon's Group or Practice Name:  [facility] Phone number:  [phone] Fax number:  [phone]   Type of Clearance Requested:   - Medical  - Pharmacy:  Hold Aspirin  -does not specify   Type of Anesthesia:  Choice   Additional requests/questions:  None  SignedPatrcia Iverson CROME   07/02/2024, 3:09 PM   "

## 2024-07-06 NOTE — Telephone Encounter (Signed)
 Pt scheduled 2/4 with Dr. Court.

## 2024-07-06 NOTE — Telephone Encounter (Signed)
 I sent a secure chat to [facility] surgery scheduler for Dr. Melodi.   ME: Good morning Kirke !! hope you are doing well today. Just FYI on this pt we have called her x 2 lvmtcb to schedule new pt appt. At this point she has not returned our calls and I am not sure that we will get her in in time for surgery clearance. as well as if MD orders any cardiac testing. she needs to call [phone] and schedule new pt app with MD.

## 2024-07-07 ENCOUNTER — Encounter: Payer: Self-pay | Admitting: Cardiovascular Disease

## 2024-07-07 ENCOUNTER — Ambulatory Visit: Admitting: Cardiovascular Disease

## 2024-07-07 VITALS — BP 118/68 | HR 74 | Ht 65.0 in | Wt 175.8 lb

## 2024-07-07 DIAGNOSIS — R011 Cardiac murmur, unspecified: Secondary | ICD-10-CM | POA: Diagnosis not present

## 2024-07-07 DIAGNOSIS — R002 Palpitations: Secondary | ICD-10-CM

## 2024-07-07 DIAGNOSIS — I421 Obstructive hypertrophic cardiomyopathy: Secondary | ICD-10-CM

## 2024-07-07 DIAGNOSIS — I2510 Atherosclerotic heart disease of native coronary artery without angina pectoris: Secondary | ICD-10-CM | POA: Diagnosis not present

## 2024-07-07 DIAGNOSIS — E782 Mixed hyperlipidemia: Secondary | ICD-10-CM | POA: Diagnosis not present

## 2024-07-07 DIAGNOSIS — Z01818 Encounter for other preprocedural examination: Secondary | ICD-10-CM | POA: Insufficient documentation

## 2024-07-07 MED ORDER — METOPROLOL TARTRATE 25 MG PO TABS: 25.0000 mg | ORAL_TABLET | Freq: Two times a day (BID) | ORAL | 3 refills | Status: AC

## 2024-07-07 MED ORDER — ROSUVASTATIN CALCIUM 10 MG PO TABS: 10.0000 mg | ORAL_TABLET | Freq: Every day | ORAL | 3 refills | Status: AC

## 2024-07-07 NOTE — Assessment & Plan Note (Signed)
 History of palpitations in the past with event monitoring performed 617/22 that showed frequent PVCs and short runs of SVT.  These no longer seem to be a clinical issue.

## 2024-07-07 NOTE — Assessment & Plan Note (Signed)
 History of CAD with coronary calcium  score 12/30/2019 of 27.  She in addition had a negative Myoview  02/28/2023.  She is asymptomatic.

## 2024-07-07 NOTE — Assessment & Plan Note (Signed)
 History of hyperlipidemia on Crestor  with lipid profile performed 05/10/2024 revealing total cholesterol 138, LDL 63 and HDL 46, at goal for secondary prevention.

## 2024-07-07 NOTE — Assessment & Plan Note (Signed)
 Outflow tract murmur probably related to hypertrophic cardiomyopathy with a resting 18 mm outflow tract rate.

## 2024-07-07 NOTE — Assessment & Plan Note (Signed)
 Hypertrophic cardiomyopathy 2D echo performed 02/28/2023 revealing normal LV systolic function, moderate basal septal hypertrophy with outflow track gradient of 18 mmHg.  She did have mild pulmonary hypertension as well with no other significant valvular abnormalities.  She is asymptomatic from this.

## 2024-07-07 NOTE — Assessment & Plan Note (Signed)
 Patient scheduled for right total knee replacement on 05/11/2025 by Dr.Alusio.  She had her other knee done several years ago which was uneventful.  She does have normal LV systolic function with moderate basal septal hypertrophy and resting gradient of 18 mmHg.  She had a negative Myoview  stress test performed 02/28/2023 and is asymptomatic.  I believe she is low risk for upcoming orthopedic procedure.
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
## 2024-07-07 NOTE — Patient Instructions (Signed)

## 2024-07-07 NOTE — Progress Notes (Signed)
 "     07/07/2024 Christina Melendez   1945/11/29  [id number]  Primary Physician Marvene Prentice SAUNDERS, FNP Primary Cardiologist: Dorn JINNY Lesches MD FACP, FACC, FAHA, FSCAI  HPI:  Christina Melendez is a 79 y.o.     moderately overweight married Caucasian female with no children referred by Dr. Laurent for cardiovascular evaluation because of new onset dyspnea.  I last saw her in the office 05/21/2023.  He is accompanied by her husband Max who is also my patient today.  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  She has no cardiovascular risk factors.  She walks approximately a mile a day.  There is no family history of heart disease.  She is never had a heart attack or stroke.  She has never smoked.  She is noticed increasing dyspnea the last 6 months especially when walking around the house.  She specifically denies chest pain.  She does have a soft outflow tract murmur by auscultation.   She had an echo performed as 27/24 that suggested moderate asymmetric septal hypertrophy without outflow tract gradient and mild to moderate pulmonary hypertension.  A Myoview  stress test performed 02/28/2023 was low risk and nonischemic.  She did have an event monitor performed 11/17/2020 that showed frequent PACs and short runs of SVT.  She has also had her total knee replacement on the left she has been less active and does complain of fatigue.   Since I saw her a year ago she now requires right total knee replacement with Dr.Alusio 05/11/2025.  She is asymptomatic with regards to her heart.  Active Medications[1]   Allergies[2]  Social History   Socioeconomic History   Marital status: Married    Spouse name: Not on file   Number of children: Not on file   Years of education: Not on file   Highest education level: Not on file  Occupational History   Not on file  Tobacco Use   Smoking status: Never   Smokeless tobacco: Never  Vaping Use   Vaping status: Never Used   Substance and Sexual Activity   Alcohol use: Never   Drug use: No   Sexual activity: Not Currently    Birth control/protection: Surgical  Other Topics Concern   Not on file  Social History Narrative   ** Merged History Encounter **    Right handed   Rarely drink caffine   Social Drivers of Health   Tobacco Use: Low Risk (07/07/2024)   Patient History    Smoking Tobacco Use: Never    Smokeless Tobacco Use: Never    Passive Exposure: Not on file  Financial Resource Strain: Not on file  Food Insecurity: Low Risk (06/29/2024)   Received from [facility]   Epic    Within the past 12 months, you worried that your food would run out before you got money to buy more: Never true    Within the past 12 months, the food you bought just didn't last and you didn't have money to get more. : Never true  Transportation Needs: No Transportation Needs (06/29/2024)   Received from [facility]    In the past 12 months, has lack of reliable transportation kept you from medical appointments, meetings, work or from getting things needed for daily living? : No  Physical Activity: Not on file  Stress: Not on file  Social Connections: Socially Integrated (01/01/2024)   Social Connection and Isolation Panel  Frequency of Communication with Friends and Family: More than three times a week    Frequency of Social Gatherings with Friends and Family: More than three times a week    Attends Religious Services: More than 4 times per year    Active Member of [employer] or Organizations: Yes    Attends Banker Meetings: More than 4 times per year    Marital Status: Married  Catering Manager Violence: Not At Risk (01/01/2024)   Epic    Fear of Current or Ex-Partner: No    Emotionally Abused: No    Physically Abused: No    Sexually Abused: No  Depression (PHQ2-9): Not on file  Alcohol Screen: Not on file  Housing: Low Risk (06/29/2024)   Received from [facility]   Epic    What  is your living situation today?: I have a steady place to live    Think about the place you live. Do you have problems with any of the following? Choose all that apply:: None/None on this list  Utilities: Low Risk (06/29/2024)   Received from [facility]   Utilities    In the past 12 months has the electric, gas, oil, or water  company threatened to shut off services in your home? : No  Health Literacy: Not on file     Review of Systems: General: negative for chills, fever, night sweats or weight changes.  Cardiovascular: negative for chest pain, dyspnea on exertion, edema, orthopnea, palpitations, paroxysmal nocturnal dyspnea or shortness of breath Dermatological: negative for rash Respiratory: negative for cough or wheezing Urologic: negative for hematuria Abdominal: negative for nausea, vomiting, diarrhea, bright red blood per rectum, melena, or hematemesis Neurologic: negative for visual changes, syncope, or dizziness All other systems reviewed and are otherwise negative except as noted above.    Blood pressure 118/68, pulse 74, height 5' 5 (1.651 m), weight 175 lb 12.8 oz (79.7 kg), SpO2 96%.  General appearance: alert and no distress Neck: no adenopathy, no carotid bruit, no JVD, supple, symmetrical, trachea midline, and thyroid  not enlarged, symmetric, no tenderness/mass/nodules Lungs: clear to auscultation bilaterally Heart: 2/6 outflow tract murmur consistent with basal septal hypertrophy and a small outflow track gradient. Extremities: extremities normal, atraumatic, no cyanosis or edema Pulses: 2+ and symmetric Skin: Skin color, texture, turgor normal. No rashes or lesions Neurologic: Grossly normal  EKG EKG Interpretation Date/Time:  Wednesday July 07 2024 10:05:40 EST Ventricular Rate:  74 PR Interval:  176 QRS Duration:  108 QT Interval:  400 QTC Calculation: 444 R Axis:   -41  Text Interpretation: Normal sinus rhythm Left axis deviation Left ventricular  hypertrophy with repolarization abnormality ( R in aVL , Cornell product , Romhilt-Estes ) Cannot rule out Septal infarct , age undetermined When compared with ECG of 31-Dec-2023 22:38, PREVIOUS ECG IS PRESENT Confirmed by Court Carrier [id number]) on 07/07/2024 10:20:09 AM    ASSESSMENT AND PLAN:   Cardiac murmur Outflow tract murmur probably related to hypertrophic cardiomyopathy with a resting 18 mm outflow tract rate.  CAD (coronary artery disease) History of CAD with coronary calcium  score 12/30/2019 of 27.  She in addition had a negative Myoview  02/28/2023.  She is asymptomatic.  Palpitations History of palpitations in the past with event monitoring performed 617/22 that showed frequent PVCs and short runs of SVT.  These no longer seem to be a clinical issue.  HOCM (hypertrophic obstructive cardiomyopathy) (HCC) Hypertrophic cardiomyopathy 2D echo performed 02/28/2023 revealing normal LV systolic function, moderate basal septal hypertrophy  with outflow track gradient of 18 mmHg.  She did have mild pulmonary hypertension as well with no other significant valvular abnormalities.  She is asymptomatic from this.  Hyperlipidemia History of hyperlipidemia on Crestor  with lipid profile performed 05/10/2024 revealing total cholesterol 138, LDL 63 and HDL 46, at goal for secondary prevention.  Preoperative clearance Patient scheduled for right total knee replacement on 05/11/2025 by Dr.Alusio.  She had her other knee done several years ago which was uneventful.  She does have normal LV systolic function with moderate basal septal hypertrophy and resting gradient of 18 mmHg.  She had a negative Myoview  stress test performed 02/28/2023 and is asymptomatic.  I believe she is low risk for upcoming orthopedic procedure.     Dorn DOROTHA Lesches MD FACP,FACC,FAHA, [facility] 07/07/2024 10:28 AM    [1]  Current Meds  Medication Sig   Ascorbic Acid (VITAMIN C) 1000 MG tablet Take 1,000 mg by mouth daily.    aspirin  EC 81 MG tablet Take 81 mg by mouth daily. Swallow whole.   Calcium  Carb-Cholecalciferol (CALCIUM  + D3 PO) Take 1 tablet by mouth in the morning and at bedtime.   esomeprazole (NEXIUM) 20 MG capsule Take 20 mg by mouth daily.   estradiol  (ESTRACE ) 0.01 % CREA vaginal cream Place 1 Applicatorful vaginally 3 (three) times a week. AS NEEDED   estradiol  (ESTRACE ) 0.5 MG tablet Take 0.5 mg by mouth daily.   FLUoxetine  (PROZAC ) 20 MG capsule Take 20 mg by mouth daily.   fluticasone (FLONASE) 50 MCG/ACT nasal spray Place 1 spray into both nostrils daily.   furosemide  (LASIX ) 20 MG tablet Take 1 tablet (20 mg total) by mouth every Monday, Wednesday, and Friday.   magnesium oxide (MAG-OX) 400 (240 Mg) MG tablet Take 400 mg by mouth daily as needed (constipation).   meclizine  (ANTIVERT ) 25 MG tablet Take 50 mg by mouth daily.   ondansetron  (ZOFRAN ) 4 MG tablet Take 4 mg by mouth every 8 (eight) hours as needed for nausea or vomiting.   temazepam  (RESTORIL ) 30 MG capsule Take 30 mg by mouth at bedtime.   traMADol (ULTRAM) 50 MG tablet Take 50-100 mg by mouth See admin instructions. Take 2 tablets by mouth in the morning and 1 tablet at night   [DISCONTINUED] metoprolol  tartrate (LOPRESSOR ) 25 MG tablet Take 1 tablet (25 mg total) by mouth 2 (two) times daily. Pt must schedule an overdue followup appt with Cardiology for any more refills. [phone] 3rd and FINAL attempt Thank You   [DISCONTINUED] rosuvastatin  (CRESTOR ) 10 MG tablet Take 1 tablet (10 mg total) by mouth daily.  [2] No Known Allergies  "

## 2024-07-09 ENCOUNTER — Encounter (HOSPITAL_COMMUNITY): Payer: Self-pay
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
## 2024-07-12 ENCOUNTER — Ambulatory Visit (HOSPITAL_COMMUNITY): Admission: RE | Admit: 2024-07-12 | Source: Ambulatory Visit | Admitting: Orthopedic Surgery

## 2024-07-12 ENCOUNTER — Encounter (HOSPITAL_COMMUNITY): Payer: Self-pay | Admitting: Medical

## 2024-07-12 ENCOUNTER — Encounter (HOSPITAL_COMMUNITY): Admission: RE | Payer: Self-pay | Source: Ambulatory Visit

## 2024-07-12 ENCOUNTER — Encounter (HOSPITAL_COMMUNITY): Payer: Self-pay | Admitting: Anesthesiology
